# Patient Record
Sex: Female | Born: 1935 | Race: White | Hispanic: No | Smoking: Never smoker
Health system: Southern US, Community
[De-identification: ages and names within clinical notes are randomized; demographics above are authoritative.]

## PROBLEM LIST (undated history)

## (undated) DIAGNOSIS — E119 Type 2 diabetes mellitus without complications: Secondary | ICD-10-CM

## (undated) DIAGNOSIS — K219 Gastro-esophageal reflux disease without esophagitis: Secondary | ICD-10-CM

## (undated) DIAGNOSIS — F329 Major depressive disorder, single episode, unspecified: Secondary | ICD-10-CM

## (undated) DIAGNOSIS — F32A Depression, unspecified: Secondary | ICD-10-CM

## (undated) DIAGNOSIS — D649 Anemia, unspecified: Secondary | ICD-10-CM

## (undated) DIAGNOSIS — Z950 Presence of cardiac pacemaker: Secondary | ICD-10-CM

## (undated) DIAGNOSIS — F419 Anxiety disorder, unspecified: Secondary | ICD-10-CM

## (undated) DIAGNOSIS — I4891 Unspecified atrial fibrillation: Secondary | ICD-10-CM

## (undated) DIAGNOSIS — M1990 Unspecified osteoarthritis, unspecified site: Secondary | ICD-10-CM

## (undated) DIAGNOSIS — R296 Repeated falls: Secondary | ICD-10-CM

## (undated) DIAGNOSIS — R4702 Dysphasia: Secondary | ICD-10-CM

## (undated) DIAGNOSIS — I5030 Unspecified diastolic (congestive) heart failure: Secondary | ICD-10-CM

## (undated) HISTORY — PX: KNEE ARTHROCENTESIS: SUR44

---

## 2018-03-06 ENCOUNTER — Other Ambulatory Visit
Admission: RE | Admit: 2018-03-06 | Discharge: 2018-03-06 | Disposition: A | Discharge status: Home / Self Care | Payer: Medicare HMO | Source: Ambulatory Visit | Attending: Family Medicine | Admitting: Family Medicine

## 2018-03-06 DIAGNOSIS — I10 Essential (primary) hypertension: Secondary | ICD-10-CM | POA: Diagnosis not present

## 2018-03-06 DIAGNOSIS — N390 Urinary tract infection, site not specified: Secondary | ICD-10-CM | POA: Insufficient documentation

## 2018-03-06 LAB — URINALYSIS, COMPLETE (UACMP) WITH MICROSCOPIC
Bilirubin Urine: NEGATIVE
Glucose, UA: NEGATIVE mg/dL
Hgb urine dipstick: NEGATIVE
KETONES UR: NEGATIVE mg/dL
Leukocytes, UA: NEGATIVE
Nitrite: NEGATIVE
Protein, ur: NEGATIVE mg/dL
Specific Gravity, Urine: 1.012 (ref 1.005–1.030)
pH: 5 (ref 5.0–8.0)

## 2018-03-06 LAB — COMPREHENSIVE METABOLIC PANEL
ALT: 13 U/L (ref 0–44)
AST: 23 U/L (ref 15–41)
Albumin: 2.8 g/dL — ABNORMAL LOW (ref 3.5–5.0)
Alkaline Phosphatase: 61 U/L (ref 38–126)
Anion gap: 14 (ref 5–15)
BUN: 45 mg/dL — ABNORMAL HIGH (ref 8–23)
CO2: 27 mmol/L (ref 22–32)
Calcium: 8 mg/dL — ABNORMAL LOW (ref 8.9–10.3)
Chloride: 96 mmol/L — ABNORMAL LOW (ref 98–111)
Creatinine, Ser: 1.38 mg/dL — ABNORMAL HIGH (ref 0.44–1.00)
GFR calc Af Amer: 41 mL/min — ABNORMAL LOW (ref >60)
GFR calc non Af Amer: 36 mL/min — ABNORMAL LOW (ref >60)
GLUCOSE: 141 mg/dL — AB (ref 70–99)
Potassium: 3.6 mmol/L (ref 3.5–5.1)
Sodium: 137 mmol/L (ref 135–145)
Total Bilirubin: 1.3 mg/dL — ABNORMAL HIGH (ref 0.3–1.2)
Total Protein: 5.9 g/dL — ABNORMAL LOW (ref 6.5–8.1)

## 2018-03-06 LAB — CBC WITH DIFFERENTIAL/PLATELET
Abs Immature Granulocytes: 0.03 10*3/uL (ref 0.00–0.07)
Basophils Absolute: 0 10*3/uL (ref 0.0–0.1)
Basophils Relative: 1 %
EOS ABS: 0 10*3/uL (ref 0.0–0.5)
EOS PCT: 2 %
HCT: 34.2 % — ABNORMAL LOW (ref 36.0–46.0)
Hemoglobin: 10.1 g/dL — ABNORMAL LOW (ref 12.0–15.0)
Immature Granulocytes: 2 %
Lymphocytes Relative: 40 %
Lymphs Abs: 0.8 10*3/uL (ref 0.7–4.0)
MCH: 28.3 pg (ref 26.0–34.0)
MCHC: 29.5 g/dL — ABNORMAL LOW (ref 30.0–36.0)
MCV: 95.8 fL (ref 80.0–100.0)
Monocytes Absolute: 0.1 10*3/uL (ref 0.1–1.0)
Monocytes Relative: 6 %
Neutro Abs: 1 10*3/uL — ABNORMAL LOW (ref 1.7–7.7)
Neutrophils Relative %: 49 %
Platelets: 252 10*3/uL (ref 150–400)
RBC: 3.57 MIL/uL — ABNORMAL LOW (ref 3.87–5.11)
RDW: 22.6 % — ABNORMAL HIGH (ref 11.5–15.5)
WBC: 2 10*3/uL — ABNORMAL LOW (ref 4.0–10.5)
nRBC: 4.1 % — ABNORMAL HIGH (ref 0.0–0.2)

## 2018-03-07 LAB — PATHOLOGIST SMEAR REVIEW

## 2018-03-08 LAB — URINE CULTURE: Culture: >=100000 — AB

## 2018-03-13 ENCOUNTER — Other Ambulatory Visit: Payer: Self-pay

## 2018-03-13 ENCOUNTER — Emergency Department: Payer: Medicare HMO

## 2018-03-13 ENCOUNTER — Inpatient Hospital Stay
Admission: EM | Admit: 2018-03-13 | Discharge: 2018-03-16 | DRG: 308 | Disposition: A | Discharge status: Nursing Home (Includes ICF) | Payer: Medicare HMO | Attending: Internal Medicine | Admitting: Internal Medicine

## 2018-03-13 ENCOUNTER — Encounter: Payer: Self-pay | Admitting: Emergency Medicine

## 2018-03-13 DIAGNOSIS — Z7951 Long term (current) use of inhaled steroids: Secondary | ICD-10-CM | POA: Diagnosis not present

## 2018-03-13 DIAGNOSIS — F0390 Unspecified dementia without behavioral disturbance: Secondary | ICD-10-CM | POA: Diagnosis present

## 2018-03-13 DIAGNOSIS — R778 Other specified abnormalities of plasma proteins: Secondary | ICD-10-CM

## 2018-03-13 DIAGNOSIS — I248 Other forms of acute ischemic heart disease: Secondary | ICD-10-CM | POA: Diagnosis present

## 2018-03-13 DIAGNOSIS — I495 Sick sinus syndrome: Secondary | ICD-10-CM | POA: Diagnosis present

## 2018-03-13 DIAGNOSIS — R634 Abnormal weight loss: Secondary | ICD-10-CM | POA: Diagnosis not present

## 2018-03-13 DIAGNOSIS — Z7901 Long term (current) use of anticoagulants: Secondary | ICD-10-CM | POA: Diagnosis not present

## 2018-03-13 DIAGNOSIS — N289 Disorder of kidney and ureter, unspecified: Secondary | ICD-10-CM

## 2018-03-13 DIAGNOSIS — I959 Hypotension, unspecified: Secondary | ICD-10-CM | POA: Diagnosis present

## 2018-03-13 DIAGNOSIS — R7989 Other specified abnormal findings of blood chemistry: Secondary | ICD-10-CM | POA: Diagnosis not present

## 2018-03-13 DIAGNOSIS — I4821 Permanent atrial fibrillation: Secondary | ICD-10-CM | POA: Diagnosis not present

## 2018-03-13 DIAGNOSIS — W19XXXD Unspecified fall, subsequent encounter: Secondary | ICD-10-CM | POA: Diagnosis not present

## 2018-03-13 DIAGNOSIS — F329 Major depressive disorder, single episode, unspecified: Secondary | ICD-10-CM | POA: Diagnosis present

## 2018-03-13 DIAGNOSIS — M069 Rheumatoid arthritis, unspecified: Secondary | ICD-10-CM | POA: Diagnosis present

## 2018-03-13 DIAGNOSIS — K219 Gastro-esophageal reflux disease without esophagitis: Secondary | ICD-10-CM | POA: Diagnosis present

## 2018-03-13 DIAGNOSIS — Z66 Do not resuscitate: Secondary | ICD-10-CM | POA: Diagnosis present

## 2018-03-13 DIAGNOSIS — E875 Hyperkalemia: Secondary | ICD-10-CM | POA: Diagnosis not present

## 2018-03-13 DIAGNOSIS — I878 Other specified disorders of veins: Secondary | ICD-10-CM | POA: Diagnosis present

## 2018-03-13 DIAGNOSIS — Z880 Allergy status to penicillin: Secondary | ICD-10-CM

## 2018-03-13 DIAGNOSIS — R296 Repeated falls: Secondary | ICD-10-CM | POA: Diagnosis present

## 2018-03-13 DIAGNOSIS — G9341 Metabolic encephalopathy: Secondary | ICD-10-CM | POA: Diagnosis present

## 2018-03-13 DIAGNOSIS — I5032 Chronic diastolic (congestive) heart failure: Secondary | ICD-10-CM | POA: Diagnosis present

## 2018-03-13 DIAGNOSIS — Z9981 Dependence on supplemental oxygen: Secondary | ICD-10-CM

## 2018-03-13 DIAGNOSIS — E119 Type 2 diabetes mellitus without complications: Secondary | ICD-10-CM | POA: Diagnosis present

## 2018-03-13 DIAGNOSIS — N179 Acute kidney failure, unspecified: Secondary | ICD-10-CM | POA: Diagnosis present

## 2018-03-13 DIAGNOSIS — N189 Chronic kidney disease, unspecified: Secondary | ICD-10-CM | POA: Diagnosis not present

## 2018-03-13 DIAGNOSIS — D631 Anemia in chronic kidney disease: Secondary | ICD-10-CM | POA: Diagnosis present

## 2018-03-13 DIAGNOSIS — I110 Hypertensive heart disease with heart failure: Secondary | ICD-10-CM | POA: Diagnosis present

## 2018-03-13 DIAGNOSIS — Z515 Encounter for palliative care: Secondary | ICD-10-CM | POA: Diagnosis present

## 2018-03-13 DIAGNOSIS — Z882 Allergy status to sulfonamides status: Secondary | ICD-10-CM | POA: Diagnosis not present

## 2018-03-13 DIAGNOSIS — F419 Anxiety disorder, unspecified: Secondary | ICD-10-CM | POA: Diagnosis present

## 2018-03-13 DIAGNOSIS — I4891 Unspecified atrial fibrillation: Secondary | ICD-10-CM | POA: Diagnosis present

## 2018-03-13 DIAGNOSIS — Z7983 Long term (current) use of bisphosphonates: Secondary | ICD-10-CM | POA: Diagnosis not present

## 2018-03-13 DIAGNOSIS — E860 Dehydration: Secondary | ICD-10-CM | POA: Diagnosis present

## 2018-03-13 DIAGNOSIS — Z950 Presence of cardiac pacemaker: Secondary | ICD-10-CM

## 2018-03-13 DIAGNOSIS — Z888 Allergy status to other drugs, medicaments and biological substances status: Secondary | ICD-10-CM | POA: Diagnosis not present

## 2018-03-13 DIAGNOSIS — Z79899 Other long term (current) drug therapy: Secondary | ICD-10-CM

## 2018-03-13 DIAGNOSIS — L8990 Pressure ulcer of unspecified site, unspecified stage: Secondary | ICD-10-CM

## 2018-03-13 DIAGNOSIS — M160 Bilateral primary osteoarthritis of hip: Secondary | ICD-10-CM | POA: Diagnosis present

## 2018-03-13 HISTORY — DX: Gastro-esophageal reflux disease without esophagitis: K21.9

## 2018-03-13 HISTORY — DX: Anxiety disorder, unspecified: F41.9

## 2018-03-13 HISTORY — DX: Presence of cardiac pacemaker: Z95.0

## 2018-03-13 HISTORY — DX: Dysphasia: R47.02

## 2018-03-13 HISTORY — DX: Depression, unspecified: F32.A

## 2018-03-13 HISTORY — DX: Type 2 diabetes mellitus without complications: E11.9

## 2018-03-13 HISTORY — DX: Unspecified osteoarthritis, unspecified site: M19.90

## 2018-03-13 HISTORY — DX: Repeated falls: R29.6

## 2018-03-13 HISTORY — DX: Anemia, unspecified: D64.9

## 2018-03-13 HISTORY — DX: Unspecified diastolic (congestive) heart failure: I50.30

## 2018-03-13 HISTORY — DX: Unspecified atrial fibrillation: I48.91

## 2018-03-13 HISTORY — DX: Major depressive disorder, single episode, unspecified: F32.9

## 2018-03-13 LAB — CBC
HCT: 34.9 % — ABNORMAL LOW (ref 36.0–46.0)
Hemoglobin: 10.5 g/dL — ABNORMAL LOW (ref 12.0–15.0)
MCH: 28.1 pg (ref 26.0–34.0)
MCHC: 30.1 g/dL (ref 30.0–36.0)
MCV: 93.3 fL (ref 80.0–100.0)
NRBC: 0.6 % — AB (ref 0.0–0.2)
Platelets: 295 10*3/uL (ref 150–400)
RBC: 3.74 MIL/uL — ABNORMAL LOW (ref 3.87–5.11)
RDW: 22.7 % — ABNORMAL HIGH (ref 11.5–15.5)
WBC: 5.4 10*3/uL (ref 4.0–10.5)

## 2018-03-13 LAB — BASIC METABOLIC PANEL
Anion gap: 14 (ref 5–15)
BUN: 36 mg/dL — ABNORMAL HIGH (ref 8–23)
CO2: 24 mmol/L (ref 22–32)
CREATININE: 1.86 mg/dL — AB (ref 0.44–1.00)
Calcium: 8.9 mg/dL (ref 8.9–10.3)
Chloride: 92 mmol/L — ABNORMAL LOW (ref 98–111)
GFR calc Af Amer: 29 mL/min — ABNORMAL LOW (ref >60)
GFR calc non Af Amer: 25 mL/min — ABNORMAL LOW (ref >60)
Glucose, Bld: 144 mg/dL — ABNORMAL HIGH (ref 70–99)
Potassium: 4.3 mmol/L (ref 3.5–5.1)
Sodium: 130 mmol/L — ABNORMAL LOW (ref 135–145)

## 2018-03-13 LAB — URINALYSIS, COMPLETE (UACMP) WITH MICROSCOPIC
Bilirubin Urine: NEGATIVE
Glucose, UA: NEGATIVE mg/dL
Ketones, ur: NEGATIVE mg/dL
Leukocytes, UA: NEGATIVE
Nitrite: NEGATIVE
Protein, ur: NEGATIVE mg/dL
Specific Gravity, Urine: 1.012 (ref 1.005–1.030)
pH: 5 (ref 5.0–8.0)

## 2018-03-13 LAB — TROPONIN I: Troponin I: 0.06 ng/mL (ref <0.03)

## 2018-03-13 LAB — TSH: TSH: 4.366 u[IU]/mL (ref 0.350–4.500)

## 2018-03-13 MED ORDER — NYSTATIN 100000 UNIT/ML MT SUSP
5.0000 mL | Freq: Four times a day (QID) | OROMUCOSAL | Status: DC
  Administered 2018-03-13 – 2018-03-15 (×6): 500000 [IU] via ORAL
  Filled 2018-03-13 (×8): qty 5

## 2018-03-13 MED ORDER — POTASSIUM CHLORIDE CRYS ER 10 MEQ PO TBCR
10.0000 meq | EXTENDED_RELEASE_TABLET | Freq: Every day | ORAL | Status: DC
  Administered 2018-03-13 – 2018-03-14 (×2): 10 meq via ORAL
  Filled 2018-03-13 (×2): qty 1

## 2018-03-13 MED ORDER — DILTIAZEM HCL ER COATED BEADS 120 MG PO CP24
120.0000 mg | ORAL_CAPSULE | Freq: Every day | ORAL | Status: DC
  Administered 2018-03-13: 120 mg via ORAL
  Filled 2018-03-13 (×2): qty 1

## 2018-03-13 MED ORDER — DOCUSATE SODIUM 100 MG PO CAPS
100.0000 mg | ORAL_CAPSULE | Freq: Two times a day (BID) | ORAL | Status: DC
  Administered 2018-03-13 – 2018-03-15 (×4): 100 mg via ORAL
  Filled 2018-03-13 (×5): qty 1

## 2018-03-13 MED ORDER — SODIUM CHLORIDE 0.9 % IV SOLN
INTRAVENOUS | Status: AC
  Administered 2018-03-13 (×2): via INTRAVENOUS

## 2018-03-13 MED ORDER — METHOTREXATE 2.5 MG PO TABS
12.5000 mg | ORAL_TABLET | ORAL | Status: DC
  Filled 2018-03-13: qty 5

## 2018-03-13 MED ORDER — FUROSEMIDE 40 MG PO TABS: 40.0000 mg | ORAL_TABLET | Freq: Every day | ORAL | Status: DC

## 2018-03-13 MED ORDER — PANTOPRAZOLE SODIUM 40 MG PO TBEC
40.0000 mg | DELAYED_RELEASE_TABLET | Freq: Every day | ORAL | Status: DC
  Administered 2018-03-13 – 2018-03-15 (×3): 40 mg via ORAL
  Filled 2018-03-13 (×3): qty 1

## 2018-03-13 MED ORDER — DULOXETINE HCL 30 MG PO CPEP
30.0000 mg | ORAL_CAPSULE | Freq: Every day | ORAL | Status: DC
  Administered 2018-03-13 – 2018-03-15 (×3): 30 mg via ORAL
  Filled 2018-03-13 (×4): qty 1

## 2018-03-13 MED ORDER — BISACODYL 5 MG PO TBEC: 5.0000 mg | DELAYED_RELEASE_TABLET | Freq: Every day | ORAL | Status: DC | PRN

## 2018-03-13 MED ORDER — DILTIAZEM HCL 25 MG/5ML IV SOLN
10.0000 mg | Freq: Once | INTRAVENOUS | Status: AC
  Administered 2018-03-13: 10 mg via INTRAVENOUS
  Filled 2018-03-13: qty 5

## 2018-03-13 MED ORDER — FUROSEMIDE 40 MG PO TABS: 80.0000 mg | ORAL_TABLET | Freq: Two times a day (BID) | ORAL | Status: DC | PRN

## 2018-03-13 MED ORDER — ALENDRONATE SODIUM 70 MG PO TABS: 70.0000 mg | ORAL_TABLET | ORAL | Status: DC

## 2018-03-13 MED ORDER — FOLIC ACID 1 MG PO TABS
1.0000 mg | ORAL_TABLET | Freq: Every day | ORAL | Status: DC
  Administered 2018-03-13 – 2018-03-15 (×3): 1 mg via ORAL
  Filled 2018-03-13 (×3): qty 1

## 2018-03-13 MED ORDER — DILTIAZEM HCL-DEXTROSE 100-5 MG/100ML-% IV SOLN (PREMIX)
5.0000 mg/h | INTRAVENOUS | Status: DC
  Administered 2018-03-13: 5 mg/h via INTRAVENOUS
  Filled 2018-03-13: qty 100

## 2018-03-13 MED ORDER — METOPROLOL TARTRATE 50 MG PO TABS
100.0000 mg | ORAL_TABLET | Freq: Two times a day (BID) | ORAL | Status: DC
  Administered 2018-03-13: 100 mg via ORAL
  Filled 2018-03-13 (×2): qty 2

## 2018-03-13 MED ORDER — ORAL CARE MOUTH RINSE
15.0000 mL | Freq: Two times a day (BID) | OROMUCOSAL | Status: DC
  Administered 2018-03-13 – 2018-03-16 (×4): 15 mL via OROMUCOSAL

## 2018-03-13 MED ORDER — ESCITALOPRAM OXALATE 10 MG PO TABS
5.0000 mg | ORAL_TABLET | Freq: Every day | ORAL | Status: DC
  Administered 2018-03-13 – 2018-03-15 (×3): 5 mg via ORAL
  Filled 2018-03-13 (×4): qty 0.5

## 2018-03-13 MED ORDER — RIVAROXABAN 15 MG PO TABS
15.0000 mg | ORAL_TABLET | Freq: Every day | ORAL | Status: DC
  Administered 2018-03-13 – 2018-03-14 (×2): 15 mg via ORAL
  Filled 2018-03-13 (×2): qty 1

## 2018-03-13 MED ORDER — FLUCONAZOLE 100 MG PO TABS
100.0000 mg | ORAL_TABLET | Freq: Every day | ORAL | Status: DC
  Administered 2018-03-13 – 2018-03-14 (×2): 100 mg via ORAL
  Filled 2018-03-13 (×2): qty 1

## 2018-03-13 MED ORDER — SENNOSIDES-DOCUSATE SODIUM 8.6-50 MG PO TABS
2.0000 | ORAL_TABLET | Freq: Every day | ORAL | Status: DC
  Administered 2018-03-13 – 2018-03-15 (×2): 2 via ORAL
  Filled 2018-03-13 (×3): qty 2

## 2018-03-13 MED ORDER — ONDANSETRON HCL 4 MG PO TABS: 4.0000 mg | ORAL_TABLET | Freq: Four times a day (QID) | ORAL | Status: DC | PRN

## 2018-03-13 MED ORDER — SODIUM CHLORIDE 0.9 % IV BOLUS
500.0000 mL | Freq: Once | INTRAVENOUS | Status: AC
  Administered 2018-03-13: 500 mL via INTRAVENOUS

## 2018-03-13 MED ORDER — ACETAMINOPHEN 325 MG PO TABS
650.0000 mg | ORAL_TABLET | Freq: Four times a day (QID) | ORAL | Status: DC | PRN
  Administered 2018-03-15 – 2018-03-16 (×2): 650 mg via ORAL
  Filled 2018-03-13 (×2): qty 2

## 2018-03-13 MED ORDER — FERROUS SULFATE 325 (65 FE) MG PO TABS
325.0000 mg | ORAL_TABLET | Freq: Every day | ORAL | Status: DC
  Administered 2018-03-13 – 2018-03-15 (×3): 325 mg via ORAL
  Filled 2018-03-13 (×3): qty 1

## 2018-03-13 MED ORDER — PREDNISONE 5 MG PO TABS
7.5000 mg | ORAL_TABLET | Freq: Every day | ORAL | Status: DC
  Administered 2018-03-13 – 2018-03-15 (×3): 7.5 mg via ORAL
  Filled 2018-03-13 (×4): qty 1.5

## 2018-03-13 MED ORDER — POLYETHYLENE GLYCOL 3350 17 G PO PACK
17.0000 g | PACK | Freq: Every day | ORAL | Status: DC
  Administered 2018-03-14: 17 g via ORAL
  Filled 2018-03-13: qty 1

## 2018-03-13 MED ORDER — DILTIAZEM HCL 25 MG/5ML IV SOLN
10.0000 mg | Freq: Once | INTRAVENOUS | Status: AC
  Administered 2018-03-13: 10 mg via INTRAVENOUS

## 2018-03-13 MED ORDER — VITAMIN B-12 1000 MCG PO TABS
1000.0000 ug | ORAL_TABLET | Freq: Every day | ORAL | Status: DC
  Administered 2018-03-13 – 2018-03-15 (×3): 1000 ug via ORAL
  Filled 2018-03-13 (×3): qty 1

## 2018-03-13 MED ORDER — ADULT MULTIVITAMIN W/MINERALS CH
1.0000 | ORAL_TABLET | Freq: Every day | ORAL | Status: DC
  Administered 2018-03-14 – 2018-03-15 (×2): 1 via ORAL
  Filled 2018-03-13 (×2): qty 1

## 2018-03-13 MED ORDER — ACETAMINOPHEN 650 MG RE SUPP: 650.0000 mg | Freq: Four times a day (QID) | RECTAL | Status: DC | PRN

## 2018-03-13 MED ORDER — ONDANSETRON HCL 4 MG/2ML IJ SOLN: 4.0000 mg | Freq: Four times a day (QID) | INTRAMUSCULAR | Status: DC | PRN

## 2018-03-13 MED ORDER — MORPHINE SULFATE (PF) 2 MG/ML IV SOLN
2.0000 mg | Freq: Once | INTRAVENOUS | Status: AC
  Administered 2018-03-13: 2 mg via INTRAVENOUS
  Filled 2018-03-13: qty 1

## 2018-03-13 NOTE — Progress Notes (Signed)

## 2018-03-13 NOTE — ED Notes (Addendum)
Daughter Cindy Hogan So called  [phone] and given update on pt. Pt's daughter verified pt's hx with this RN.

## 2018-03-13 NOTE — Clinical Social Work Note (Addendum)
CSW spoke with patient's daughter Cindy Hogan [phone], she stated that [facility] was [facility] called [facility] in [place] on Tuesday.  Patient's daughter would like patient to discharge back to ALF from hospital instead of going back to [facility].  CSW attempted to contact Misty Stanley the administrator for ALF [phone], and she is out of the office today.  CSW left a message awaiting for call back.  CSW completed assessment with patient's daughter due to patient having some confusion today.  CSW to continue to follow patient's progress throughout discharge planning.  Ervin Knack. Clare Casto, MSW, Theresia Majors [phone]  03/13/2018 5:22 PM

## 2018-03-13 NOTE — ED Notes (Signed)
Ernestine at [facility] contacted and informed about pt's admission status.

## 2018-03-13 NOTE — H&P (Signed)
Cindy Hogan is an 84 y.o. female.   Chief Complaint: Fall HPI: The patient with past medical history of CHF, atrial fibrillation and rheumatoid arthritis presents to the emergency department after a fall.  She was at [facility] when she was found on the floor.  The patient complained of pain in her hip, particularly on the left.  X-ray demonstrated no fractures.  However the patient was found to be in atrial fibrillation with rapid ventricular rate.  She denies chest pain or shortness of breath.  2 IV boluses of diltiazem were administered but did not significantly improve heart rate which prompted the emergency department staff to start a diltiazem drip prior to calling the hospitalist service for admission.  Past Medical History:  Diagnosis Date  . Anemia   . Arthritis   . Atrial fibrillation (HCC)   . CHF (congestive heart failure) (HCC)   . Depression   . Dysphasia   . GERD (gastroesophageal reflux disease)     Past Surgical History:  Procedure Laterality Date  . KNEE ARTHROCENTESIS      No family history on file. Heart disease in older generations  Social History:  reports previous alcohol use. She reports previous drug use. No history on file for tobacco.  Allergies:  Allergies  Allergen Reactions  . Eliquis [Apixaban]   . Lisinopril   . Neosporin [Neomycin-Bacitracin Zn-Polymyx]   . Penicillins   . Sulfa Antibiotics     Medications Prior to Admission  Medication Sig Dispense Refill  . albuterol (PROVENTIL HFA;VENTOLIN HFA) 108 (90 Base) MCG/ACT inhaler Inhale 2 puffs into the lungs every 6 (six) hours as needed for wheezing or shortness of breath.    Marland Kitchen alendronate (FOSAMAX) 70 MG tablet Take 70 mg by mouth once a week. Take with a full glass of water on an empty stomach.    . diltiazem (CARDIZEM CD) 120 MG 24 hr capsule Take 120 mg by mouth daily.    . DULoxetine (CYMBALTA) 30 MG capsule Take 30 mg by mouth daily.    Marland Kitchen escitalopram (LEXAPRO) 5 MG tablet Take 5 mg  by mouth daily.    . ferrous sulfate 325 (65 FE) MG tablet Take 325 mg by mouth daily.    . fluconazole (DIFLUCAN) 100 MG tablet Take 100 mg by mouth daily.    . folic acid (FOLVITE) 1 MG tablet Take 1 mg by mouth daily.    . furosemide (LASIX) 40 MG tablet Take 80 mg by mouth 2 (two) times daily as needed (weight gain of 2lbs in 1 day or 5lbs in 1 week).    . furosemide (LASIX) 40 MG tablet Take 40 mg by mouth daily.    . methotrexate (RHEUMATREX) 2.5 MG tablet Take 12.5 mg by mouth every Thursday. Caution:Chemotherapy. Protect from light.    . metoprolol tartrate (LOPRESSOR) 100 MG tablet Take 100 mg by mouth 2 (two) times daily.    . Multiple Vitamin (MULTIVITAMIN WITH MINERALS) TABS tablet Take 1 tablet by mouth daily.    Marland Kitchen nystatin (MYCOSTATIN) 100000 UNIT/ML suspension Take 5 mLs by mouth 4 (four) times daily.    . pantoprazole (PROTONIX) 40 MG tablet Take 40 mg by mouth daily.    . polyethylene glycol (MIRALAX / GLYCOLAX) packet Take 17 g by mouth daily.    . potassium chloride (K-DUR,KLOR-CON) 10 MEQ tablet Take 10 mEq by mouth daily.    . predniSONE (DELTASONE) 2.5 MG tablet Take 7.5 mg by mouth daily.    . Rivaroxaban (  XARELTO) 15 MG TABS tablet Take 15 mg by mouth daily at 6 PM.    . senna-docusate (SENOKOT-S) 8.6-50 MG tablet Take 2 tablets by mouth at bedtime.    . vitamin B-12 (CYANOCOBALAMIN) 1000 MCG tablet Take 1,000 mcg by mouth daily.      Results for orders placed or performed during the hospital encounter of 03/13/18 (from the past 48 hour(s))  Basic metabolic panel     Status: Abnormal   Collection Time: 03/13/18  2:31 AM  Result Value Ref Range   Sodium 130 (L) 135 - 145 mmol/L   Potassium 4.3 3.5 - 5.1 mmol/L   Chloride 92 (L) 98 - 111 mmol/L   CO2 24 22 - 32 mmol/L   Glucose, Bld 144 (H) 70 - 99 mg/dL   BUN 36 (H) 8 - 23 mg/dL   Creatinine, Ser 1.01 (H) 0.44 - 1.00 mg/dL   Calcium 8.9 8.9 - 75.1 mg/dL   GFR calc non Af Amer 25 (L) >60 mL/min   GFR calc Af Amer  29 (L) >60 mL/min   Anion gap 14 5 - 15    Comment: Performed at [facility], [address] [place], [place] [address]  CBC     Status: Abnormal   Collection Time: 03/13/18  2:31 AM  Result Value Ref Range   WBC 5.4 4.0 - 10.5 K/uL   RBC 3.74 (L) 3.87 - 5.11 MIL/uL   Hemoglobin 10.5 (L) 12.0 - 15.0 g/dL   HCT 27.7 (L) 82.4 - 23.5 %   MCV 93.3 80.0 - 100.0 fL   MCH 28.1 26.0 - 34.0 pg   MCHC 30.1 30.0 - 36.0 g/dL   RDW 36.1 (H) 44.3 - 15.4 %   Platelets 295 150 - 400 K/uL   nRBC 0.6 (H) 0.0 - 0.2 %    Comment: Performed at [facility], [address] [place], [place] [address]  Troponin I - Once     Status: Abnormal   Collection Time: 03/13/18  2:31 AM  Result Value Ref Range   Troponin I 0.06 (HH) <0.03 ng/mL    Comment: CRITICAL RESULT CALLED TO, READ BACK BY AND VERIFIED WITH BECCA LYNN ON 03/13/18 AT 0309 [facility] Performed at [facility], [address] [place], [place] [address]   Urinalysis, Complete w Microscopic     Status: Abnormal   Collection Time: 03/13/18  2:31 AM  Result Value Ref Range   Color, Urine AMBER (A) YELLOW    Comment: BIOCHEMICALS MAY BE AFFECTED BY COLOR   APPearance HAZY (A) CLEAR   Specific Gravity, Urine 1.012 1.005 - 1.030   pH 5.0 5.0 - 8.0   Glucose, UA NEGATIVE NEGATIVE mg/dL   Hgb urine dipstick MODERATE (A) NEGATIVE   Bilirubin Urine NEGATIVE NEGATIVE   Ketones, ur NEGATIVE NEGATIVE mg/dL   Protein, ur NEGATIVE NEGATIVE mg/dL   Nitrite NEGATIVE NEGATIVE   Leukocytes, UA NEGATIVE NEGATIVE   RBC / HPF 6-10 0 - 5 RBC/hpf   WBC, UA 0-5 0 - 5 WBC/hpf   Bacteria, UA RARE (A) NONE SEEN   Squamous Epithelial / LPF 0-5 0 - 5   Budding Yeast PRESENT     Comment: Performed at [facility], [address] [place], [place] [address]   Ct Head Wo Contrast  Result Date: 03/13/2018 CLINICAL DATA:  Found down. RIGHT leg bruising. History of atrial fibrillation. EXAM: CT HEAD WITHOUT CONTRAST CT CERVICAL  SPINE WITHOUT CONTRAST TECHNIQUE: Multidetector CT  imaging of the head and cervical spine was performed following the standard protocol without intravenous contrast. Multiplanar CT image reconstructions of the cervical spine were also generated. COMPARISON:  None. FINDINGS: CT HEAD FINDINGS-mild motion degraded examination. BRAIN: No intraparenchymal hemorrhage, mass effect nor midline shift. The ventricles and sulci are normal for age. Patchy supratentorial white matter hypodensities. No acute large vascular territory infarcts. No abnormal extra-axial fluid collections. Basal cisterns are patent. VASCULAR: Mild-to-moderate calcific atherosclerosis of the carotid siphons. SKULL: No skull fracture. No significant scalp soft tissue swelling. SINUSES/ORBITS: Trace paranasal sinus mucosal thickening. Minimal LEFT mastoid effusion. Included ocular globes and orbital contents are non-suspicious. Status post bilateral ocular lens implants. Punctate LEFT ocular globe anterior chain burr foreign body most compatible with glaucoma drainage device. OTHER: None. CT CERVICAL SPINE FINDINGS ALIGNMENT: Straightened cervical lordosis. Grade 1 C3-4 through C5-6 and C7-T1 anterolisthesis. SKULL BASE AND VERTEBRAE: Vertebral bodies intact. Multilevel severe degenerative discs and severe facet arthropathy. C1-2 articulation maintained with severe arthropathy. RIGHT atlantooccipital arthrodesis. Old LEFT second rib fracture with callus. No destructive bony lesions. SOFT TISSUES AND SPINAL CANAL: Nonacute. Heterogeneous thyroid. Moderate calcific atherosclerosis carotid bifurcations. DISC LEVELS: Severe canal stenosis C3-4 and C5-6. Severe neural foraminal narrowing all cervical levels. UPPER CHEST: Lung apices are clear. Pacemaker leads via LEFT subclavian venous approach. OTHER: None. IMPRESSION: CT HEAD: 1. No acute intracranial process. 2. Moderate parenchymal brain volume loss and moderate chronic small vessel ischemic changes. CT  cervical spine: 1. No fracture.  Multilevel spondylolisthesis on degenerative basis. 2. Severe canal stenosis C3-4 and C5-6. Severe neural foraminal narrowing all levels. Electronically Signed   By: Awilda Metro M.D.   On: 03/13/2018 03:37   Ct Cervical Spine Wo Contrast  Result Date: 03/13/2018 CLINICAL DATA:  Found down. RIGHT leg bruising. History of atrial fibrillation. EXAM: CT HEAD WITHOUT CONTRAST CT CERVICAL SPINE WITHOUT CONTRAST TECHNIQUE: Multidetector CT imaging of the head and cervical spine was performed following the standard protocol without intravenous contrast. Multiplanar CT image reconstructions of the cervical spine were also generated. COMPARISON:  None. FINDINGS: CT HEAD FINDINGS-mild motion degraded examination. BRAIN: No intraparenchymal hemorrhage, mass effect nor midline shift. The ventricles and sulci are normal for age. Patchy supratentorial white matter hypodensities. No acute large vascular territory infarcts. No abnormal extra-axial fluid collections. Basal cisterns are patent. VASCULAR: Mild-to-moderate calcific atherosclerosis of the carotid siphons. SKULL: No skull fracture. No significant scalp soft tissue swelling. SINUSES/ORBITS: Trace paranasal sinus mucosal thickening. Minimal LEFT mastoid effusion. Included ocular globes and orbital contents are non-suspicious. Status post bilateral ocular lens implants. Punctate LEFT ocular globe anterior chain burr foreign body most compatible with glaucoma drainage device. OTHER: None. CT CERVICAL SPINE FINDINGS ALIGNMENT: Straightened cervical lordosis. Grade 1 C3-4 through C5-6 and C7-T1 anterolisthesis. SKULL BASE AND VERTEBRAE: Vertebral bodies intact. Multilevel severe degenerative discs and severe facet arthropathy. C1-2 articulation maintained with severe arthropathy. RIGHT atlantooccipital arthrodesis. Old LEFT second rib fracture with callus. No destructive bony lesions. SOFT TISSUES AND SPINAL CANAL: Nonacute.  Heterogeneous thyroid. Moderate calcific atherosclerosis carotid bifurcations. DISC LEVELS: Severe canal stenosis C3-4 and C5-6. Severe neural foraminal narrowing all cervical levels. UPPER CHEST: Lung apices are clear. Pacemaker leads via LEFT subclavian venous approach. OTHER: None. IMPRESSION: CT HEAD: 1. No acute intracranial process. 2. Moderate parenchymal brain volume loss and moderate chronic small vessel ischemic changes. CT cervical spine: 1. No fracture.  Multilevel spondylolisthesis on degenerative basis. 2. Severe canal stenosis C3-4 and C5-6. Severe neural foraminal narrowing all levels. Electronically Signed  By: Awilda Metro M.D.   On: 03/13/2018 03:37   Ct Pelvis Wo Contrast  Result Date: 03/13/2018 CLINICAL DATA:  Post fall with left hip pain. EXAM: CT PELVIS WITHOUT CONTRAST TECHNIQUE: Multidetector CT imaging of the pelvis was performed following the standard protocol without intravenous contrast. COMPARISON:  Radiographs earlier this day. FINDINGS: Urinary Tract: Distal ureters are decompressed. Urinary bladder is distended without wall thickening. Bowel: Minimal colonic diverticulosis. Pelvic bowel loops are unremarkable. Normal appendix. Vascular/Lymphatic: Mild aorta bi-iliac atherosclerosis. No adenopathy. Reproductive: Uterine calcifications likely fibroids and vascular. 2.5 cm cystic structure in the left adnexa. Other:  No pelvic free fluid. Musculoskeletal: No pelvic fracture. Particularly, no fracture of the left inferior pubic ramus as questioned on radiograph. Moderate to advanced bilateral hip osteoarthritis. Pubic symphysis is congruent. Mild degenerative change of the sacroiliac joints. Prominent degenerative change in the lower lumbar spine. IMPRESSION: 1. No pelvic fracture. Particularly, no left inferior pubic ramus fracture is questioned on radiograph. 2. Moderate to advanced bilateral hip osteoarthritis. 3. A 2.5 cm cystic structure in the left adnexa is most likely  benign, given size less than 3 cm and late postmenopausal status, no dedicated imaging follow-up is needed. Electronically Signed   By: Narda Rutherford M.D.   On: 03/13/2018 03:53   Dg Chest Port 1 View  Result Date: 03/13/2018 CLINICAL DATA:  Fall. EXAM: PORTABLE CHEST 1 VIEW COMPARISON:  None. FINDINGS: Left-sided pacemaker in place. The heart is enlarged. There is atherosclerosis of the thoracic aorta. No pneumothorax, focal airspace disease, pulmonary edema or pleural effusion. No acute osseous abnormalities are seen. IMPRESSION: Cardiomegaly.  No evidence of acute traumatic injury. Electronically Signed   By: Narda Rutherford M.D.   On: 03/13/2018 03:22   Dg Hip Unilat W Or Wo Pelvis 2-3 Views Left  Result Date: 03/13/2018 CLINICAL DATA:  Post fall with left hip pain. EXAM: DG HIP (WITH OR WITHOUT PELVIS) 2-3V LEFT COMPARISON:  None. FINDINGS: Possible but not definite nondisplaced left inferior ramus fracture. No other fracture of the pelvis. Pubic symphysis and sacroiliac joints are congruent. Moderate to advanced bilateral hip osteoarthritis. Both femoral heads are well-seated in the respective acetabula. IMPRESSION: 1. Possible but not definite nondisplaced left inferior ramus fracture. 2. Moderate to advanced bilateral hip osteoarthritis. Electronically Signed   By: Narda Rutherford M.D.   On: 03/13/2018 03:20    Review of Systems  Constitutional: Negative for chills and fever.  HENT: Negative for sore throat and tinnitus.   Eyes: Negative for blurred vision and redness.  Respiratory: Negative for cough and shortness of breath.   Cardiovascular: Negative for chest pain, palpitations, orthopnea and PND.  Gastrointestinal: Negative for abdominal pain, diarrhea, nausea and vomiting.  Genitourinary: Negative for dysuria, frequency and urgency.  Musculoskeletal: Positive for falls and joint pain. Negative for myalgias.  Skin: Negative for rash.       No lesions  Neurological: Negative  for speech change, focal weakness and weakness.  Endo/Heme/Allergies: Does not bruise/bleed easily.       No temperature intolerance  Psychiatric/Behavioral: Negative for depression and suicidal ideas.    Blood pressure 138/62, pulse 80, temperature (!) 97.5 F (36.4 C), temperature source Oral, resp. rate 18, height 5\' 5"  (1.651 m), weight 60.2 kg, SpO2 95 %. Physical Exam  Vitals reviewed. Constitutional: She is oriented to person, place, and time. She appears well-developed and well-nourished. No distress.  HENT:  Head: Normocephalic and atraumatic.  Mouth/Throat: Oropharynx is clear and moist.  Eyes: Pupils are  equal, round, and reactive to light. Conjunctivae and EOM are normal. No scleral icterus.  Neck: Normal range of motion. Neck supple. No JVD present. No tracheal deviation present. No thyromegaly present.  Cardiovascular: Normal rate, regular rhythm and normal heart sounds. Exam reveals no gallop and no friction rub.  No murmur heard. Respiratory: Effort normal and breath sounds normal.  GI: Soft. Bowel sounds are normal. She exhibits no distension. There is no abdominal tenderness.  Genitourinary:    Genitourinary Comments: Deferred   Musculoskeletal: Normal range of motion.        General: No edema.  Lymphadenopathy:    She has no cervical adenopathy.  Neurological: She is alert and oriented to person, place, and time. No cranial nerve deficit. She exhibits normal muscle tone.  Skin: Skin is warm and dry. No rash noted. No erythema.  Psychiatric: She has a normal mood and affect. Her behavior is normal. Judgment and thought content normal.     Assessment/Plan This is an 84 year old female admitted for atrial fibrillation with rapid ventricular rate. 1.  A. fib: With RVR; continue with diltiazem drip and Xarelto. 2.  Elevated troponin: Secondary to demand ischemia; continue to follow cardiac biomarkers.  Monitor telemetry.  Consultation discretion of primary team 3.   AKI: The patient is clinically very dehydrated.  Skin tense and lips and mucous membranes are dry.  Continue to hydrate with intravenous fluid.  Encourage oral intake. 4.  Falls: PT/OT for safety evaluation prior to discharge. 5.  CHF: The patient requires gentle rehydration.  Do not have an echocardiogram on file.  Obtain imaging for EF and wall motion and to rule out thrombus.  Hold his dose of Lasix today 6.  DVT prophylaxis: Above 7.  GI prophylaxis: Pantoprazole per home regimen The patient is a full code.  I have simply spent 45 minutes in critical care time with this patient  Arnaldo Natal, MD 03/13/2018, 7:22 AM

## 2018-03-13 NOTE — Progress Notes (Signed)
Patient's BP is soft, and HR has been staying 70-80's. OK to discontinue cardizem drip per Dr. Imogene Burn. PAtient already received her PO medications.

## 2018-03-13 NOTE — Clinical Social Work Note (Signed)
Clinical Social Work Assessment  Patient Details  Name: Cindy Hogan MRN: [id number] Date of Birth: [birth year]  Date of referral:  03/13/18               Reason for consult:  Facility Placement                Permission sought to share information with:  Facility Medical sales representative, Family Supports Permission granted to share information::  Yes, Verbal Permission Granted  Name::     Bobette Mo Other   [phone]   Agency::  ALF and SNF admissions  Relationship::     Contact Information:     Housing/Transportation Living arrangements for the past 2 months:  Skilled Holiday representative, Assisted Living Facility Source of Information:  Adult Children, Facility Patient Interpreter Needed:  None Criminal Activity/Legal Involvement Pertinent to Current Situation/Hospitalization:  No - Comment as needed Significant Relationships:  Adult Children Lives with:  Facility Resident Do you feel safe going back to the place where you live?  Yes Need for family participation in patient care:  Yes (Comment)  Care giving concerns:  Patient's daughter would like patient to return back to [facility] ALF where she lives instead of going back to [facility].   Social Worker assessment / plan:  Patient is an 84 year old female who is alert and oriented x2.  Patient has some confusion assessment completed by speaking with patient's daughter on the phone.  Patient's daughter states that patient was at [facility] receiving some short term rehab, however patient's daughter feels that patient would be better off returning back to ALF with home health instead.  Patient's daughter stated that patient was supposed to be returning back to ALF tomorrow, but unfortunately she was admitted into the hospital.  Patient's daughter was explained role of CSW and process for coordinating with ALF to help patient return back to her ALF.  Patient's daughter expressed that ALF, felt comfortable to have  patient return to ALF with home health.  CSW explained the ALF will have to decide if they are able to accept patient back or if she will need to continue with therapy.  Patient's daughter stated she would prefer patient to return to ALF.  Patient's daughter did not have any other questions or concerns, and gave CSW permission to contact ALF to discuss discharge planning.  Employment status:  Retired Database administrator PT Recommendations:  Skilled Nursing Facility Information / Referral to community resources:  Skilled Nursing Facility  Patient/Family's Response to care:  Patient and family are agreeable to returning back to ALF instead of SNF.  Patient/Family's Understanding of and Emotional Response to Diagnosis, Current Treatment, and Prognosis:  Patient and family are aware of current prognosis and treatment plan.  Patient's family are hopeful that she can discharge from the hospital to ALF.  Emotional Assessment Appearance:  Appears stated age Attitude/Demeanor/Rapport:    Affect (typically observed):  Stable, Calm Orientation:  Oriented to Self, Oriented to Place Alcohol / Substance use:  Not Applicable Psych involvement (Current and /or in the community):  No (Comment)  Discharge Needs  Concerns to be addressed:  Cognitive Concerns, Care Coordination Readmission within the last 30 days:  No Current discharge risk:  Cognitively Impaired Barriers to Discharge:  Continued Medical Work up   [place] Constable 03/13/2018, 5:38 PM

## 2018-03-13 NOTE — Progress Notes (Signed)
Metoprolol held due to BP of 99/60 and HR 72. MD made aware. No new orders

## 2018-03-13 NOTE — Progress Notes (Signed)
The patient is confused and looks demented. Vital sign is stable but blood pressure is low side.  Heart rate is better controlled with Cardizem drip.  Physical examinations is done.  Lab reviewed.  This is an 84 year old female admitted for atrial fibrillation with rapid ventricular rate. 1.  A. fib: With RVR;  Continue Xarelto, Cardizem p.o. and Lopressor p.o., discontinued diltiazem drip. 2.  Elevated troponin: Secondary to demand ischemia. 3.  AKI due to dehydration, IV fluid support and follow-up BMP.   4.  Falls: PT/OT for safety evaluation prior to discharge. 5.  CHF: Stable.  Hold Lasix, follow-up echo.  Discussed with RN, time spent about 20 minutes.

## 2018-03-13 NOTE — ED Notes (Signed)
ED TO INPATIENT HANDOFF REPORT  Name/Age/Gender Cindy Hogan 84 y.o. female  Code Status Advance Directive Documentation     Most Recent Value  Type of Advance Directive  Out of facility DNR (pink MOST or yellow form)  Pre-existing out of facility DNR order (yellow form or pink MOST form)  Pink MOST form placed in chart (order not valid for inpatient use)  "MOST" Form in Place?  -      Home/SNF/Other Nursing Home  Chief Complaint fall  Level of Care/Admitting Diagnosis ED Disposition    ED Disposition Condition Comment   Admit  Hospital Area: [facility] [[id number]]  Level of Care: Telemetry [5]  Diagnosis: Atrial fibrillation with RVR [facility]) [[id number]]  Admitting Physician: Arnaldo Natal [[id number]]  Attending Physician: Arnaldo Natal [[id number]]  Estimated length of stay: past midnight tomorrow  Certification:: I certify this patient will need inpatient services for at least 2 midnights  PT Class (Do Not Modify): Inpatient [101]  PT Acc Code (Do Not Modify): Private [1]       Medical History Past Medical History:  Diagnosis Date  . Anemia   . Arthritis   . Atrial fibrillation (HCC)   . CHF (congestive heart failure) (HCC)   . Depression   . Dysphasia   . GERD (gastroesophageal reflux disease)     Allergies Allergies  Allergen Reactions  . Eliquis [Apixaban]   . Lisinopril   . Neosporin [Neomycin-Bacitracin Zn-Polymyx]   . Penicillins   . Sulfa Antibiotics     IV Location/Drains/Wounds Patient Lines/Drains/Airways Status   Active Line/Drains/Airways    Name:   Placement date:   Placement time:   Site:   Days:   Peripheral IV 03/13/18 Left Hand   03/13/18    0237    Hand   less than 1   Peripheral IV 03/13/18 Right Forearm   03/13/18    0243    Forearm   less than 1   Pressure Injury 03/13/18 Stage II -  Partial thickness loss of dermis presenting as a shallow open ulcer with a red, pink wound bed without slough. Sacral dressing  applied.    03/13/18    0307     less than 1          Labs/Imaging Results for orders placed or performed during the hospital encounter of 03/13/18 (from the past 48 hour(s))  Basic metabolic panel     Status: Abnormal   Collection Time: 03/13/18  2:31 AM  Result Value Ref Range   Sodium 130 (L) 135 - 145 mmol/L   Potassium 4.3 3.5 - 5.1 mmol/L   Chloride 92 (L) 98 - 111 mmol/L   CO2 24 22 - 32 mmol/L   Glucose, Bld 144 (H) 70 - 99 mg/dL   BUN 36 (H) 8 - 23 mg/dL   Creatinine, Ser 8.30 (H) 0.44 - 1.00 mg/dL   Calcium 8.9 8.9 - 94.0 mg/dL   GFR calc non Af Amer 25 (L) >60 mL/min   GFR calc Af Amer 29 (L) >60 mL/min   Anion gap 14 5 - 15    Comment: Performed at [facility], [address] [place], [place] [address]  CBC     Status: Abnormal   Collection Time: 03/13/18  2:31 AM  Result Value Ref Range   WBC 5.4 4.0 - 10.5 K/uL   RBC 3.74 (L) 3.87 - 5.11 MIL/uL   Hemoglobin 10.5 (L) 12.0 - 15.0 g/dL   HCT  34.9 (L) 36.0 - 46.0 %   MCV 93.3 80.0 - 100.0 fL   MCH 28.1 26.0 - 34.0 pg   MCHC 30.1 30.0 - 36.0 g/dL   RDW 02.5 (H) 85.2 - 77.8 %   Platelets 295 150 - 400 K/uL   nRBC 0.6 (H) 0.0 - 0.2 %    Comment: Performed at [facility], [address] [place], [place] [address]  Troponin I - Once     Status: Abnormal   Collection Time: 03/13/18  2:31 AM  Result Value Ref Range   Troponin I 0.06 (HH) <0.03 ng/mL    Comment: CRITICAL RESULT CALLED TO, READ BACK BY AND VERIFIED WITH BECCA Shauntell Iglesia ON 03/13/18 AT 0309 [facility] Performed at [facility], [address] [place], [place] [address]   Urinalysis, Complete w Microscopic     Status: Abnormal   Collection Time: 03/13/18  2:31 AM  Result Value Ref Range   Color, Urine AMBER (A) YELLOW    Comment: BIOCHEMICALS MAY BE AFFECTED BY COLOR   APPearance HAZY (A) CLEAR   Specific Gravity, Urine 1.012 1.005 - 1.030   pH 5.0 5.0 - 8.0   Glucose, UA NEGATIVE NEGATIVE mg/dL   Hgb urine dipstick MODERATE  (A) NEGATIVE   Bilirubin Urine NEGATIVE NEGATIVE   Ketones, ur NEGATIVE NEGATIVE mg/dL   Protein, ur NEGATIVE NEGATIVE mg/dL   Nitrite NEGATIVE NEGATIVE   Leukocytes, UA NEGATIVE NEGATIVE   RBC / HPF 6-10 0 - 5 RBC/hpf   WBC, UA 0-5 0 - 5 WBC/hpf   Bacteria, UA RARE (A) NONE SEEN   Squamous Epithelial / LPF 0-5 0 - 5   Budding Yeast PRESENT     Comment: Performed at [facility], [address], [place], [place] [address]   Ct Head Wo Contrast  Result Date: 03/13/2018 CLINICAL DATA:  Found down. RIGHT leg bruising. History of atrial fibrillation. EXAM: CT HEAD WITHOUT CONTRAST CT CERVICAL SPINE WITHOUT CONTRAST TECHNIQUE: Multidetector CT imaging of the head and cervical spine was performed following the standard protocol without intravenous contrast. Multiplanar CT image reconstructions of the cervical spine were also generated. COMPARISON:  None. FINDINGS: CT HEAD FINDINGS-mild motion degraded examination. BRAIN: No intraparenchymal hemorrhage, mass effect nor midline shift. The ventricles and sulci are normal for age. Patchy supratentorial white matter hypodensities. No acute large vascular territory infarcts. No abnormal extra-axial fluid collections. Basal cisterns are patent. VASCULAR: Mild-to-moderate calcific atherosclerosis of the carotid siphons. SKULL: No skull fracture. No significant scalp soft tissue swelling. SINUSES/ORBITS: Trace paranasal sinus mucosal thickening. Minimal LEFT mastoid effusion. Included ocular globes and orbital contents are non-suspicious. Status post bilateral ocular lens implants. Punctate LEFT ocular globe anterior chain burr foreign body most compatible with glaucoma drainage device. OTHER: None. CT CERVICAL SPINE FINDINGS ALIGNMENT: Straightened cervical lordosis. Grade 1 C3-4 through C5-6 and C7-T1 anterolisthesis. SKULL BASE AND VERTEBRAE: Vertebral bodies intact. Multilevel severe degenerative discs and severe facet arthropathy. C1-2 articulation  maintained with severe arthropathy. RIGHT atlantooccipital arthrodesis. Old LEFT second rib fracture with callus. No destructive bony lesions. SOFT TISSUES AND SPINAL CANAL: Nonacute. Heterogeneous thyroid. Moderate calcific atherosclerosis carotid bifurcations. DISC LEVELS: Severe canal stenosis C3-4 and C5-6. Severe neural foraminal narrowing all cervical levels. UPPER CHEST: Lung apices are clear. Pacemaker leads via LEFT subclavian venous approach. OTHER: None. IMPRESSION: CT HEAD: 1. No acute intracranial process. 2. Moderate parenchymal brain volume loss and moderate chronic small vessel ischemic changes. CT cervical spine: 1. No fracture.  Multilevel spondylolisthesis  on degenerative basis. 2. Severe canal stenosis C3-4 and C5-6. Severe neural foraminal narrowing all levels. Electronically Signed   By: Awilda Metro M.D.   On: 03/13/2018 03:37   Ct Cervical Spine Wo Contrast  Result Date: 03/13/2018 CLINICAL DATA:  Found down. RIGHT leg bruising. History of atrial fibrillation. EXAM: CT HEAD WITHOUT CONTRAST CT CERVICAL SPINE WITHOUT CONTRAST TECHNIQUE: Multidetector CT imaging of the head and cervical spine was performed following the standard protocol without intravenous contrast. Multiplanar CT image reconstructions of the cervical spine were also generated. COMPARISON:  None. FINDINGS: CT HEAD FINDINGS-mild motion degraded examination. BRAIN: No intraparenchymal hemorrhage, mass effect nor midline shift. The ventricles and sulci are normal for age. Patchy supratentorial white matter hypodensities. No acute large vascular territory infarcts. No abnormal extra-axial fluid collections. Basal cisterns are patent. VASCULAR: Mild-to-moderate calcific atherosclerosis of the carotid siphons. SKULL: No skull fracture. No significant scalp soft tissue swelling. SINUSES/ORBITS: Trace paranasal sinus mucosal thickening. Minimal LEFT mastoid effusion. Included ocular globes and orbital contents are  non-suspicious. Status post bilateral ocular lens implants. Punctate LEFT ocular globe anterior chain burr foreign body most compatible with glaucoma drainage device. OTHER: None. CT CERVICAL SPINE FINDINGS ALIGNMENT: Straightened cervical lordosis. Grade 1 C3-4 through C5-6 and C7-T1 anterolisthesis. SKULL BASE AND VERTEBRAE: Vertebral bodies intact. Multilevel severe degenerative discs and severe facet arthropathy. C1-2 articulation maintained with severe arthropathy. RIGHT atlantooccipital arthrodesis. Old LEFT second rib fracture with callus. No destructive bony lesions. SOFT TISSUES AND SPINAL CANAL: Nonacute. Heterogeneous thyroid. Moderate calcific atherosclerosis carotid bifurcations. DISC LEVELS: Severe canal stenosis C3-4 and C5-6. Severe neural foraminal narrowing all cervical levels. UPPER CHEST: Lung apices are clear. Pacemaker leads via LEFT subclavian venous approach. OTHER: None. IMPRESSION: CT HEAD: 1. No acute intracranial process. 2. Moderate parenchymal brain volume loss and moderate chronic small vessel ischemic changes. CT cervical spine: 1. No fracture.  Multilevel spondylolisthesis on degenerative basis. 2. Severe canal stenosis C3-4 and C5-6. Severe neural foraminal narrowing all levels. Electronically Signed   By: Awilda Metro M.D.   On: 03/13/2018 03:37   Ct Pelvis Wo Contrast  Result Date: 03/13/2018 CLINICAL DATA:  Post fall with left hip pain. EXAM: CT PELVIS WITHOUT CONTRAST TECHNIQUE: Multidetector CT imaging of the pelvis was performed following the standard protocol without intravenous contrast. COMPARISON:  Radiographs earlier this day. FINDINGS: Urinary Tract: Distal ureters are decompressed. Urinary bladder is distended without wall thickening. Bowel: Minimal colonic diverticulosis. Pelvic bowel loops are unremarkable. Normal appendix. Vascular/Lymphatic: Mild aorta bi-iliac atherosclerosis. No adenopathy. Reproductive: Uterine calcifications likely fibroids and  vascular. 2.5 cm cystic structure in the left adnexa. Other:  No pelvic free fluid. Musculoskeletal: No pelvic fracture. Particularly, no fracture of the left inferior pubic ramus as questioned on radiograph. Moderate to advanced bilateral hip osteoarthritis. Pubic symphysis is congruent. Mild degenerative change of the sacroiliac joints. Prominent degenerative change in the lower lumbar spine. IMPRESSION: 1. No pelvic fracture. Particularly, no left inferior pubic ramus fracture is questioned on radiograph. 2. Moderate to advanced bilateral hip osteoarthritis. 3. A 2.5 cm cystic structure in the left adnexa is most likely benign, given size less than 3 cm and late postmenopausal status, no dedicated imaging follow-up is needed. Electronically Signed   By: Narda Rutherford M.D.   On: 03/13/2018 03:53   Dg Chest Port 1 View  Result Date: 03/13/2018 CLINICAL DATA:  Fall. EXAM: PORTABLE CHEST 1 VIEW COMPARISON:  None. FINDINGS: Left-sided pacemaker in place. The heart is enlarged. There is atherosclerosis of the  thoracic aorta. No pneumothorax, focal airspace disease, pulmonary edema or pleural effusion. No acute osseous abnormalities are seen. IMPRESSION: Cardiomegaly.  No evidence of acute traumatic injury. Electronically Signed   By: Narda Rutherford M.D.   On: 03/13/2018 03:22   Dg Hip Unilat W Or Wo Pelvis 2-3 Views Left  Result Date: 03/13/2018 CLINICAL DATA:  Post fall with left hip pain. EXAM: DG HIP (WITH OR WITHOUT PELVIS) 2-3V LEFT COMPARISON:  None. FINDINGS: Possible but not definite nondisplaced left inferior ramus fracture. No other fracture of the pelvis. Pubic symphysis and sacroiliac joints are congruent. Moderate to advanced bilateral hip osteoarthritis. Both femoral heads are well-seated in the respective acetabula. IMPRESSION: 1. Possible but not definite nondisplaced left inferior ramus fracture. 2. Moderate to advanced bilateral hip osteoarthritis. Electronically Signed   By: Narda Rutherford M.D.   On: 03/13/2018 03:20    Pending Labs Unresulted Labs (From admission, onward)    Start     Ordered   Signed and Held  TSH  Add-on,   R     Signed and Held          Vitals/Pain Today's Vitals   03/13/18 0430 03/13/18 0500 03/13/18 0535 03/13/18 0602  BP: 110/72 (!) 104/92 95/85 104/89  Pulse:   (!) 110 (!) 111  Resp: (!) 27 (!) 21 (!) 23 19  Temp:      TempSrc:      SpO2:   96% 96%  Weight:      Height:        Isolation Precautions No active isolations  Medications Medications  diltiazem (CARDIZEM) 100 mg in dextrose 5% (1 mg/mL) infusion (7 mg/hr Intravenous Rate/Dose Change 03/13/18 0535)  Rivaroxaban (XARELTO) tablet 15 mg (has no administration in time range)  diltiazem (CARDIZEM) injection 10 mg (10 mg Intravenous Given 03/13/18 0243)  morphine 2 MG/ML injection 2 mg (2 mg Intravenous Given 03/13/18 0356)  diltiazem (CARDIZEM) injection 10 mg (10 mg Intravenous Given 03/13/18 0417)    Mobility non-ambulatory

## 2018-03-13 NOTE — ED Notes (Signed)
Pt is on chronic 2 L oxygen. Pt found with large amount of old stool on bottom. Pt's vaginal area red and raw at this time.

## 2018-03-13 NOTE — Progress Notes (Signed)
ANTICOAGULATION CONSULT NOTE - Initial Consult  Pharmacy Consult for Xarelto dosing Indication: atrial fibrillation  Allergies  Allergen Reactions  . Eliquis [Apixaban]   . Lisinopril   . Neosporin [Neomycin-Bacitracin Zn-Polymyx]   . Penicillins   . Sulfa Antibiotics     Patient Measurements: Height: 5\' 5"  (165.1 cm) Weight: 185 lb (83.9 kg) IBW/kg (Calculated) : 57 Heparin Dosing Weight: n/a  Vital Signs: Temp: 97.4 F (36.3 C) (12/16 0304) Temp Source: Rectal (12/16 0304) BP: 95/85 (12/16 0535) Pulse Rate: 110 (12/16 0535)  Labs: Recent Labs    03/13/18 0231  HGB 10.5*  HCT 34.9*  PLT 295  CREATININE 1.86*  TROPONINI 0.06*    Estimated Creatinine Clearance: 25 mL/min (A) (by C-G formula based on SCr of 1.86 mg/dL (H)).   Medical History: Past Medical History:  Diagnosis Date  . Anemia   . Arthritis   . Atrial fibrillation (HCC)   . CHF (congestive heart failure) (HCC)   . Depression   . Dysphasia   . GERD (gastroesophageal reflux disease)     Medications:    Assessment:  Goal of Therapy:      Plan:  Xarelto 15 mg daily ordered.  Cindy Hogan S 03/13/2018,5:55 AM

## 2018-03-13 NOTE — ED Triage Notes (Signed)
Pt arrives via [facility] from the Rehab area of [facility]. Pt was found on her left hip with her oxygen concentrator off. Per EMS, facility unable to answer whether pt is on anticoagulants or how she ambulates. Pt has old bruising on her right leg from unknown event that facility was not able to tell EMS. Pt is talking at this time to MD Manson Passey.

## 2018-03-13 NOTE — Consult Note (Addendum)
WOC Nurse wound consult note Reason for Consult: Consult requested for right hand Wound type: Full thickness abrasion/skin tear Measurement: 3.5X2X.1cm Wound bed: skin approximated over 50% of the wound bed; 50% red  Drainage (amount, consistency, odor) small amt tan drainage, no odor or bleeding Periwound: bruising surrounding the location Dressing procedure/placement/frequency: Xeroform gauze to protect and promote healing. No family at the bedside to discuss plan of care and patient appears to be confused. Please re-consult if further assistance is needed.  Thank-you,  Cammie Mcgee MSN, RN, CWOCN, [place], CNS [phone]

## 2018-03-13 NOTE — Progress Notes (Signed)
Patient has been alert but intermittently confused, however pleasant. She is hard of hearing and is usually able to be re-oriented. Patient was able to use bedpan with prompting today, follows commands well. Spoke with daughter on phone earlier this morning.

## 2018-03-14 ENCOUNTER — Inpatient Hospital Stay
Admit: 2018-03-14 | Discharge: 2018-03-14 | Disposition: A | Discharge status: Home / Self Care | Payer: Medicare HMO | Attending: Internal Medicine | Admitting: Internal Medicine

## 2018-03-14 ENCOUNTER — Encounter: Payer: Self-pay | Admitting: Physician Assistant

## 2018-03-14 DIAGNOSIS — R634 Abnormal weight loss: Secondary | ICD-10-CM

## 2018-03-14 DIAGNOSIS — Z515 Encounter for palliative care: Secondary | ICD-10-CM

## 2018-03-14 DIAGNOSIS — I4821 Permanent atrial fibrillation: Principal | ICD-10-CM

## 2018-03-14 LAB — CBC WITH DIFFERENTIAL/PLATELET
Abs Immature Granulocytes: 0.08 10*3/uL — ABNORMAL HIGH (ref 0.00–0.07)
Basophils Absolute: 0 10*3/uL (ref 0.0–0.1)
Basophils Relative: 0 %
Eosinophils Absolute: 0 10*3/uL (ref 0.0–0.5)
Eosinophils Relative: 0 %
HCT: 28.6 % — ABNORMAL LOW (ref 36.0–46.0)
Hemoglobin: 8.4 g/dL — ABNORMAL LOW (ref 12.0–15.0)
Immature Granulocytes: 2 %
Lymphocytes Relative: 14 %
Lymphs Abs: 0.6 10*3/uL — ABNORMAL LOW (ref 0.7–4.0)
MCH: 28.3 pg (ref 26.0–34.0)
MCHC: 29.4 g/dL — ABNORMAL LOW (ref 30.0–36.0)
MCV: 96.3 fL (ref 80.0–100.0)
MONO ABS: 0.7 10*3/uL (ref 0.1–1.0)
Monocytes Relative: 16 %
Neutro Abs: 2.8 10*3/uL (ref 1.7–7.7)
Neutrophils Relative %: 68 %
Platelets: 295 10*3/uL (ref 150–400)
RBC: 2.97 MIL/uL — ABNORMAL LOW (ref 3.87–5.11)
RDW: 22.6 % — ABNORMAL HIGH (ref 11.5–15.5)
Smear Review: NORMAL
WBC: 4.5 10*3/uL (ref 4.0–10.5)
nRBC: 0.7 % — ABNORMAL HIGH (ref 0.0–0.2)

## 2018-03-14 LAB — ECHOCARDIOGRAM COMPLETE
Height: 65 in
Weight: 2193.6 oz

## 2018-03-14 LAB — BASIC METABOLIC PANEL
Anion gap: 13 (ref 5–15)
BUN: 35 mg/dL — AB (ref 8–23)
CO2: 22 mmol/L (ref 22–32)
Calcium: 8.4 mg/dL — ABNORMAL LOW (ref 8.9–10.3)
Chloride: 99 mmol/L (ref 98–111)
Creatinine, Ser: 1.7 mg/dL — ABNORMAL HIGH (ref 0.44–1.00)
GFR calc Af Amer: 32 mL/min — ABNORMAL LOW (ref >60)
GFR calc non Af Amer: 28 mL/min — ABNORMAL LOW (ref >60)
Glucose, Bld: 113 mg/dL — ABNORMAL HIGH (ref 70–99)
Potassium: 5.1 mmol/L (ref 3.5–5.1)
Sodium: 134 mmol/L — ABNORMAL LOW (ref 135–145)

## 2018-03-14 LAB — HEMOGLOBIN A1C
HEMOGLOBIN A1C: 6.7 % — AB (ref 4.8–5.6)
Mean Plasma Glucose: 145.59 mg/dL

## 2018-03-14 LAB — POTASSIUM: Potassium: 4.4 mmol/L (ref 3.5–5.1)

## 2018-03-14 LAB — MAGNESIUM: Magnesium: 1.9 mg/dL (ref 1.7–2.4)

## 2018-03-14 MED ORDER — SODIUM CHLORIDE 0.9 % IV SOLN
INTRAVENOUS | Status: DC
  Administered 2018-03-14 (×2): via INTRAVENOUS

## 2018-03-14 MED ORDER — METOPROLOL TARTRATE 50 MG PO TABS
50.0000 mg | ORAL_TABLET | Freq: Two times a day (BID) | ORAL | Status: DC
  Filled 2018-03-14 (×2): qty 1

## 2018-03-14 NOTE — Progress Notes (Signed)
Advanced Care Plan.  Purpose of Encounter: Palliative care. Parties in Attendance: The patient, her daughter and me. Patient's Decisional Capacity: No. Medical Story: Cindy Hogan is an 84 y.o. female with history of CHF, A. fib, anemia, arthritis, GERD and depression.  The patient is admitted for A. fib with RVR with elevated troponin, acute renal failure due to dehydration.  I discussed with the patient daughter about her current condition, poor prognosis and possible palliative care or hospice care.  She is interested in palliative care and agreed to get palliative care consult. Goals of Care Determinations: Palliative care. Plan:  Code Status: DNR. Time spent discussing advance care planning: 18 minutes.

## 2018-03-14 NOTE — Progress Notes (Signed)
Wound care completed at this time to R hand area, as ordered daily. Old dressing removed, new applied with Xeroform and 1 2x2 gauze pad underneath. Patient tolerated well. Supplies left on counter for the next RN. Will pass along in shift report. Jari Favre [facility]

## 2018-03-14 NOTE — Progress Notes (Addendum)
    Spoke with daughter in law, Jamesetta So, today and obtained location and contact information for previous medical care.   Called and obtained medical record number of patient from [facility] healthcare's medical records department. Requested outside records -now present in chart.   Verified PPM as [employer] and implanted for SSS. Spoke with [employer] representative regarding interrogation of [employer] device to take place later today with paperwork placed in patient chart.    Per daughter in law, patient has history of frequent falls. Daughter stated her suspicion that this most recent fall was likely due to "rolling out of bed," as patient reportedly has a history of falling out of bed after turning over. Daughter reported she is in discussion with [facility] social worker, Minerva Areola, regarding palliative care meeting to discuss goals of care.   Signed, Lennon Alstrom, PA-C 03/14/2018, 12:18 PM Pager [phone]

## 2018-03-14 NOTE — Clinical Social Work Note (Signed)
CSW received phone call from Misty Stanley [phone] the RN at [facility], she requested some clinical information on how patient is doing.  Misty Stanley had asked if palliative can meet with patient to see if she is appropriate for hospice services in the ALF.  Misty Stanley also said that patient was using [facility] for home health before she went to [facility], CSW updated case Production designer, theatre/television/film.  CSW spoke with patient's daughter she would still prefer patient return back to ALF.  She requested EMS transport once patient is ready for discharge, because ALF will not come to the hospital to pick up patient.  Patient's daughter would like to speak with palliative to discuss goals of care and to see if patient needs hospice services to follow her since she has a decline in health.  Daughter Cindy Hogan requests that palliative call her to have a conference call.  CSW to continue to follow patient's progress throughout discharge planning.  Cindy Hogan. Cindy Hogan, MSW, [facility]resia Majors [phone]  03/14/2018 12:07 PM

## 2018-03-14 NOTE — Progress Notes (Signed)
*  PRELIMINARY RESULTS* Echocardiogram 2D Echocardiogram has been performed.  Cindy Hogan 03/14/2018, 9:21 AM

## 2018-03-14 NOTE — Consult Note (Signed)
Consultation Note Date: 03/15/2018   Patient Name: Cindy Hogan  DOB: 11-25-1933  MRN: [id number]  Age / Sex: 84 y.o., female  PCP: Juluis Pitch, MD Referring Physician: Demetrios Loll, MD  Reason for Consultation: Establishing goals of care  HPI/Patient Profile: 84 y.o. female  with past medical history of frequent falls, heart failure (EF 55% with pulmonary HTN), rheumatoid arthritis, and pacemaker placement who was admitted on 03/13/2018 with a fall and was found to have atrial fibrillation with RVR, hypotension and acute renal failure.  She has no previous medical records in our Epic system.   She had a hospitalization at [facility] earlier this month.  Clinical Assessment and Goals of Care:  I have reviewed medical records including EPIC notes, labs and imaging, received report from Dr. Bridgett Larsson, assessed the patient and then met at the bedside along with her daughter Silva Bandy, and her son Gershon Mussel was on the phone, to discuss diagnosis prognosis, GOC, EOL wishes, disposition and options.  I introduced Palliative Medicine as specialized medical care for people living with serious illness. It focuses on providing relief from the symptoms and stress of a serious illness. The goal is to improve quality of life for both the patient and the family.  We discussed a brief life review of the patient.  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  She was disabled in 1976 with rheumatoid arthritis.  She married Phyllis's father when Silva Bandy was 42 years old.  Since her husband passed 18 months ago Silva Bandy and Gershon Mussel have become Pat's primary caregivers.  Silva Bandy is her healthcare power of attorney.  As far as functional and nutritional status she is primarily bedbound.  She has had frequent falls.  But when she falls she rolls out of bed.  Just in the last several months she has become fully dependent for all ADLs.  She cannot  feed herself.  She has intermittent confusion.  She appears to have some underlying dementia.  Her desire to eat and drink waxes and wanes.  Today she was spitting out medications and refusing to eat or drink.  We discussed her current illness and what it means in the larger context of her on-going co-morbidities.  Natural disease trajectory and expectations at EOL were discussed.  I attempted to elicit values and goals of care important to the patient.  Fraser Din tells me that she prioritizes comfort and quality over longevity.  She prefers not to return to the hospital but rather to receive medical care in place as needed.  She does not desire for her life to be prolonged.  The difference between aggressive medical intervention and comfort care was considered in light of the patient's goals of care.  Advanced directives, concepts specific to code status, artifical feeding and hydration, and rehospitalization were considered and discussed. A MOST form was completed with her son and daughter -the following choices were made: 1.  DNR 2.  Comfort measures only, do not return to the hospital unless the patient cannot be  kept comfortable in her current location 3.  Determine the need for antibiotics when infection occurs.  Antibiotics are to be used as a comfort measure. 4.  No IV fluids (we discussed the cycle of not eating and drinking, dehydration, hospitalization-the family does not want that) 5.  No feeding tube or artificial feeding.  Hospice and Palliative Care services outpatient were explained and offered.  The patient's son Gershon Mussel worked as an Optometrist with hospice for a period of time.  The family thinks very highly of hospice services.  They would like to engage hospice services at ALF to assist with care of Pat.  Questions and concerns were addressed.  Hard Choices booklet left for review. The family was encouraged to call with questions or concerns.     Primary Decision Maker:  Ulyess Blossom.   However both tOM and the patient were engaged in the conversation as well.    SUMMARY OF RECOMMENDATIONS   1.  Most form completed.  Family opts for comfort measures and hospice services. 2.  Discharge to ALF with hospice services when medically ready. 3.  Minimize medications.  Daily aspirin, statin, multivitamin have been discontinued but maintenance medications such as antidepressants, methotrexate, metoprolol will be continued as the patient has a prognosis of up to 6 months. 4.  Agree with discontinuation of anticoagulation as the patient has frequent falls  Code Status/Advance Care Planning:  DNR  Symptom Management:   Agree with Tylenol Extra Strength for pain  Additional Recommendations (Limitations, Scope, Preferences):  Avoid Hospitalization, Full Comfort Care, No Artificial Feeding, No IV Antibiotics and No IV Fluids  Psycho-social/Spiritual:   Desire for further Chaplaincy support: Yes, requested  Prognosis: Less than 6 months given 2 hospitalizations in quick succession, rapid decline, significant weight loss, minimal p.o. intake, bedbound status, family and patient's desires for comfort measures only.  Patient does not want to return to the hospital.    Discharge Planning: Assisted living facility with hospice      Primary Diagnoses: Present on Admission: . Atrial fibrillation with RVR ([place])   I have reviewed the medical record, interviewed the patient and family, and examined the patient. The following aspects are pertinent.  Past Medical History:  Diagnosis Date  . Anemia   . Anxiety   . Arthritis   . Atrial fibrillation ([place])    on chronic anticoagulation with Xarelto  . Depression   . Diabetes ([place])    Pending updated medical records and A1C with stepdaughter reporting history of DM2 on medications  . Diastolic heart failure ([place])    12/17 Echo: EF normal. PA peak pressure: 59 mm Hg   . Dysphasia   . Frequent falls   . GERD (gastroesophageal  reflux disease)   . Pacemaker    [employer]onic   Social History   Socioeconomic History  . Marital status: Widowed    Spouse name: Not on file  . Number of children: Not on file  . Years of education: Not on file  . Highest education level: Not on file  Occupational History  . Not on file  Social Needs  . Financial resource strain: Not on file  . Food insecurity:    Worry: Not on file    Inability: Not on file  . Transportation needs:    Medical: Not on file    Non-medical: Not on file  Tobacco Use  . Smoking status: Unknown If Ever Smoked  Substance and Sexual Activity  . Alcohol use: Not Currently  .  Drug use: Not Currently  . Sexual activity: Not on file  Lifestyle  . Physical activity:    Days per week: Not on file    Minutes per session: Not on file  . Stress: Not on file  Relationships  . Social connections:    Talks on phone: Not on file    Gets together: Not on file    Attends religious service: Not on file    Active member of club or organization: Not on file    Attends meetings of clubs or organizations: Not on file    Relationship status: Not on file  Other Topics Concern  . Not on file  Social History Narrative  . Not on file   No family history on file. Scheduled Meds: . docusate sodium  100 mg Oral BID  . DULoxetine  30 mg Oral Daily  . escitalopram  5 mg Oral Daily  . ferrous sulfate  325 mg Oral Daily  . fluconazole  100 mg Oral Daily  . folic acid  1 mg Oral Daily  . mouth rinse  15 mL Mouth Rinse BID  . [START ON 03/16/2018] methotrexate  12.5 mg Oral Q Thu  . metoprolol tartrate  50 mg Oral BID  . multivitamin with minerals  1 tablet Oral Daily  . nystatin  5 mL Oral QID  . pantoprazole  40 mg Oral Daily  . polyethylene glycol  17 g Oral Daily  . predniSONE  7.5 mg Oral Daily  . rivaroxaban  15 mg Oral Daily  . senna-docusate  2 tablet Oral QHS  . vitamin B-12  1,000 mcg Oral Daily   Continuous Infusions: . sodium chloride 75 mL/hr at  03/14/18 0949   PRN Meds:.acetaminophen **OR** acetaminophen, bisacodyl, ondansetron **OR** ondansetron (ZOFRAN) IV Allergies  Allergen Reactions  . Eliquis [Apixaban]   . Lisinopril   . Neosporin [Neomycin-Bacitracin Zn-Polymyx]   . Penicillins   . Sulfa Antibiotics    Review of Systems patient complains of generalized pain and discomfort in her neck, constipation.  She denies shortness of breath, chest pain, hunger.  Physical Exam  Well-developed, elderly female with poor dentition, she is very hard of hearing and requires hearing aids Respiratory clear to auscultation with no increased work of breathing Cardiovascular irregular rate and rhythm with systolic murmur 3/6 Abdomen soft, nontender, nondistended Lower extremities with 1+ edema  Vital Signs: BP (!) 100/49 (BP Location: Left Arm)   Pulse 76   Temp (!) 97.3 F (36.3 C) (Oral)   Resp 18   Ht _0  (1.651 m)   Wt 62.2 kg   SpO2 90%   BMI 22.81 kg/m  Pain Scale: 0-10   Pain Score: 0-No pain   SpO2: SpO2: 90 % O2 Device:SpO2: 90 % O2 Flow Rate: .O2 Flow Rate (L/min): 2 L/min  IO: Intake/output summary:   Intake/Output Summary (Last 24 hours) at 03/14/2018 1447 Last data filed at 03/14/2018 0404 Gross per 24 hour  Intake 416.67 ml  Output 500 ml  Net -83.33 ml    LBM: Last BM Date: (unsure) Baseline Weight: Weight: 83.9 kg Most recent weight: Weight: 62.2 kg     Palliative Assessment/Data: 30%     Time In: 2:00 Time Out: 3: 10 Time Total: 70 minutes Greater than 50%  of this time was spent counseling and coordinating care related to the above assessment and plan.  Signed by: Florentina Jenny, PA-C Palliative Medicine Pager: [phone]  Please contact Palliative Medicine Team phone at [phone]  for questions and concerns.  For individual provider: See Shea Evans

## 2018-03-14 NOTE — Plan of Care (Signed)
  Problem: Clinical Measurements: Goal: Ability to maintain clinical measurements within normal limits will improve Outcome: Not Progressing Note:  Patient for an ECHO today. Patient's B.P. = only 89 systolic this AM. Normal saline fluids restarted at 75 cc / hr. Also, palliative care consulted today to assist with G.O.C. and treatment course. Patient intermittently confused throughout shift (worse this AM). Will continue to monitor overall status. Jari Favre [facility]

## 2018-03-14 NOTE — Consult Note (Addendum)
Cardiology Consultation:   Patient ID: Junell Cullifer MRN: [id number]; DOB: 30-Dec-1933  Admit date: 03/13/2018 Date of Consult: 03/14/2018  Primary Care Provider: Clover Mealy, PA Primary Cardiologist:[facility], Dr. Okey Dupre Primary Electrophysiologist:  None ; Dr. Sheran Luz   Patient Profile:   Karine Garn is a 84 y.o. female with a hx of hypertensive heart disease / ICM, chronic diastolic heart failure (Ef 55-65%), h/o SSS with subsequent Medronic dual chamber pacemaker implantation, permanent atrial fibrillation on anticoagulation, anemia, DM2 not on medication (11/7 A1C 7.0), venous stasis dermatitis, rheumatoid arthritis, osteoporosis, glaucoma, bilateral hearing loss with hearing aids, anxiety, depression, and GERD who is being seen today for the evaluation of Atrial Fibrillation at the request of Dr. Imogene Burn.  History of Present Illness:   Ms. Mishkin is an 84 year old female with past medical history as above.  Patient is status post Medtronic dual-chamber pacemaker implantation with remote pacemaker check performed every 90 days for tachybradycardia syndrome.  Patient denied history of smoking and no current alcohol or drug use.  She is originally from [place] and moved to [place] in 2000. Patient lived in ALF with husband until he passed away, approximately 1 year ago, and has since moved several times between SNF's, her previous apartment, and ALF's.  She now lives at [facility]F at full-time status. She has a documented history of anxiety and depression requiring IVC and approximately 1 month stay on [facility] unit. Her anxiety is reportedly worse with shortness of breath.Of note reported at allergies and intolerances include Eliquis and lisinopril.   09/2017: Seen by Dr. Elvin So of [facility] geriatric specialty clinic on 09/27/2017 for geriatric follow-up and evaluation and after recent hospitalization for atrial fibrillation with rapid ventricular response, heart failure  exacerbation, and AKI.  Per these progress notes, patient presented from [facility]. She was noted to be rate controlled in atrial fibrillation and on Xarelto, diltiazem 120 mg daily, and metoprolol 75 mg twice daily. Her Lasix had reportedly been titrated at [facility] with patient now taking lasix 40 mg in the morning and 20 mg at [facility] with ongoing diuresis and associated weight loss and improved symptoms. Patient's was noted to have intact memory without any recent falls and was reportedly successfully ambulating with her walker and completing physical therapy. Of note, the patient did feel she had increased erythema and warmth in her lower extremities, worse in her left lower extremity. She had been placed antibiotics recently for a wound on her second toe of her left foot that appeared clean but slow healing with known history of venous stasis dermatitis. Patient was instructed to keep wound clean until her podiatrist appointment the next week.  11/2017: Medtronic interrogation check performed 12/12/2017 by Dr. Sheran Luz -->normal device function and tested lead measurements were documented is stable and within normal range with adequate battery and no significant new ventricular arrhythmias and 1 episode AF / 100% AF burden on Xarelto.   01/2018: Seen at [facility] emergency department 11/1 after falling and hitting her head. She was noted to have fallen out of her chair at her nursing home and onto her face but without reported LOC. She presented mildly tachycardic at ventricular rate of 109.  She was documented to have left forehead hematoma but otherwise no signs of trauma.  CT head was negative.  EKG showed A. fib with ventricular rate of 56, QRS 132, QTc 469, & right bundle branch block -->no significant changes from prior EKGs. X-ray of pelvis showed  unchanged severe right and moderate left hip osteoarthrosis and chest x-ray showed streaky bibasilar opacities, presumed likely  atelectasis. Labs obtained 02/02/2018 showed hemoglobin 10.1, RBC 3.47, hematocrit 31.9.  Hemoglobin A1c 7.0 and not on DM2 medications. Patient was ambulated and discharged. On 02/10/2018, patient was again admitted to [facility] with primary complaint of dyspnea, weakness, and fear of falling after her recent fall at home. On admission, she was hypoxic and was noted to require 2 to 4 L nasal cannula. She was found to have mild pulmonary vascular congestion on chest x-ray with diffuse bilateral crackles on exam.  Diuresis was initiated with IV Lasix 40 mg twice daily and increase  to IV Lasix 80 mg for several days then  transitioned to p.o. Lasix 40 mg BID on 11/14.  Weight was documented as improved from 75.4 kg to 74.9 kg (bed weight) at time of discharge.  TTE showed normal EF, mildly decreased persistent right ventricular systolic function, and improved but elevated PASP (50 down from previous 60).  She was noted to be in atrial fibrillation with multiple missed/delayed home medications per documentation. She was also noted to have 10-20 beats of VT per transport. She developed sustained RVR on telemetry 11/10 with increasing oxygen requirement and worsening pulmonary edema on chest x-ray. Improvement in rate noted with nitroglycerin. ACS work-up was negative at that time.  Ultimately, heart rate control was increased to metoprolol 100 mg twice daily (from 75mg ), and she was continued on diltiazem and Xarelto with control of her ventricular rates at discharge. Of note, functional decline was noted since recent fall with patient reporting progressive decline in strength for many months and unsteady gait requiring transition to ALF with increasing assistance with ambulation/transfers from staff and with wheelchair. Neurologic exam was largely unremarkable, however, with strength intact and no abnormalities on cerebellar testing. Given that patient was again noted to have lower extremity edema greater in the left side  than right and with overlying erythema in the setting of chronic known venous stasis and recent SOB despite increased lasix, she was worked up for DVT with PVL negative for DVT. Lower extremity edema was noted to improve with IV diuresis and IV abx in the ED. She was discharged to [facility].   On 02/20/2018, she was seen by Cardiology Harvel Ricks, ANP) and noted to be in Afib with well controlled rate in the 80s. She denied any significant SOB, palpitations, or chest pain. Medications included diltiazem 120 mg p.o. daily, iron supplementation 325 mg tablet daily, furosemide 40 mg tablet twice daily or as needed for weight gain of 2 pounds in 1 day or 5 pounds in 1 week, and metoprolol tartrate 100 mg twice daily. It was noted that, given the patient's recent falls and dependent on her progressive rehab, reassessment might be needed for patient's anticoagulation on Xarelto.  She appeared euvolemic on exam but without a dry weight recorded as she could not stand on her own. Minimal ankle edema was documented.  She was on home oxygen, and per daughter, patient had increased anxiety.  Creatinine was 1.68 on 11/20 and increased from her discharge of Cr 1.1. She was being treated for a UTI.  Patient presented to [facility] ED 03/13/2018 from rehabilitation where she was reportedly found after a fall on her left hip with oxygen off at that time (with new home oxygen requirement as above). AMS reported. Per discussion with daughter in law, patient is known to fall when turning over bed. She reported suspicion  that patient had rolled out of bed and onto the floor.   In the ED 12/16, patient was in atrial fibrillation with RVR, mildly elevated troponin, and elevated creatinine. Also noted was anemia with hgb 10.5 and UA findings as below.  Vitals: BP 138/62, HR 80, T 90 7.16F, SPO2 95%  Labs: Sodium 130, potassium 4.3, creatinine 1.86, BUN 36, calcium 8.9, troponin 0.06, WBC 5.4, RBC 3.74, hemoglobin 10.5,  hematocrit 34.9, platelets 295, glucose 144, TSH 4.366  UA with moderate hemoglobin present on dipstick, rare bacteria and yeast present  EKG: significant for irregular rate 128 bpm QTC 511 and right bundle branch block. Poor R wave progression  CXR: Significant for left-sided pacemaker. Enlarged heart/cardiomegaly. Noted atherosclerosis of the thoracic aorta. No pneumothorax, focal airspace disease, pulmonary edema, or pleural effusion. No acute osseous abnormality seen. No evidence of acute traumatic injury  Hip imaging showed possible but not definite displaced left inferior ramus fracture. Moderate to advanced bilateral hip osteoarthritis.  Meds: IVF, diltiazem boluses then started on a drip. PTA lasix held d/t AKI and dehydration with IVF started and AM BMET recommended.   Pending echo, ordered by ED to assess EF.  On today's 12/17 exam, patient is oriented to self but does not know current location and states she is at [facility]. She is notably hard of hearing. She denied current chest pain, palpitations, or racing heart rate on today's 12/17 examination. No family was present at the time of my examination but more information was obtained on phone call with daughter in law, Jamesetta So, after examination of the patient.    Past Medical History:  Diagnosis Date  . Anemia   . Arthritis   . Atrial fibrillation (HCC)   . CHF (congestive heart failure) (HCC)   . Depression   . Dysphasia   . GERD (gastroesophageal reflux disease)     Past Surgical History:  Procedure Laterality Date  . KNEE ARTHROCENTESIS       Home Medications:  Prior to Admission medications   Medication Sig Start Date End Date Taking? Authorizing Provider  albuterol (PROVENTIL HFA;VENTOLIN HFA) 108 (90 Base) MCG/ACT inhaler Inhale 2 puffs into the lungs every 6 (six) hours as needed for wheezing or shortness of breath.   Yes [provider]  alendronate (FOSAMAX) 70 MG tablet Take 70 mg by mouth  once a week. Take with a full glass of water on an empty stomach.   Yes [provider]  diltiazem (CARDIZEM CD) 120 MG 24 hr capsule Take 120 mg by mouth daily.   Yes [provider]  DULoxetine (CYMBALTA) 30 MG capsule Take 30 mg by mouth daily.   Yes [provider]  escitalopram (LEXAPRO) 5 MG tablet Take 5 mg by mouth daily.   Yes [provider]  ferrous sulfate 325 (65 FE) MG tablet Take 325 mg by mouth daily.   Yes [provider]  folic acid (FOLVITE) 1 MG tablet Take 1 mg by mouth daily.   Yes [provider]  furosemide (LASIX) 40 MG tablet Take 80 mg by mouth 2 (two) times daily as needed (weight gain of 2lbs in 1 day or 5lbs in 1 week).   Yes [provider]  furosemide (LASIX) 40 MG tablet Take 40 mg by mouth daily.   Yes [provider]  methotrexate (RHEUMATREX) 2.5 MG tablet Take 12.5 mg by mouth every Thursday. Caution:Chemotherapy. Protect from light.   Yes [provider]  metoprolol tartrate (LOPRESSOR) 100  MG tablet Take 100 mg by mouth 2 (two) times daily.   Yes [provider]  Multiple Vitamin (MULTIVITAMIN WITH MINERALS) TABS tablet Take 1 tablet by mouth daily.   Yes [provider]  nystatin (MYCOSTATIN) 100000 UNIT/ML suspension Take 5 mLs by mouth 4 (four) times daily. 03/09/18 03/16/18 Yes [provider]  pantoprazole (PROTONIX) 40 MG tablet Take 40 mg by mouth daily.   Yes [provider]  polyethylene glycol (MIRALAX / GLYCOLAX) packet Take 17 g by mouth daily.   Yes [provider]  potassium chloride (K-DUR,KLOR-CON) 10 MEQ tablet Take 10 mEq by mouth daily.   Yes [provider]  predniSONE (DELTASONE) 2.5 MG tablet Take 7.5 mg by mouth daily.   Yes [provider]  Rivaroxaban (XARELTO) 15 MG TABS tablet Take 15 mg by mouth daily at 6 PM.   Yes [provider]  senna-docusate (SENOKOT-S) 8.6-50 MG tablet Take 2  tablets by mouth at bedtime.   Yes [provider]  vitamin B-12 (CYANOCOBALAMIN) 1000 MCG tablet Take 1,000 mcg by mouth daily.   Yes [provider]    Inpatient Medications: Scheduled Meds: . diltiazem  120 mg Oral Daily  . docusate sodium  100 mg Oral BID  . DULoxetine  30 mg Oral Daily  . escitalopram  5 mg Oral Daily  . ferrous sulfate  325 mg Oral Daily  . fluconazole  100 mg Oral Daily  . folic acid  1 mg Oral Daily  . mouth rinse  15 mL Mouth Rinse BID  . [START ON 03/16/2018] methotrexate  12.5 mg Oral Q Thu  . metoprolol tartrate  50 mg Oral BID  . multivitamin with minerals  1 tablet Oral Daily  . nystatin  5 mL Oral QID  . pantoprazole  40 mg Oral Daily  . polyethylene glycol  17 g Oral Daily  . potassium chloride  10 mEq Oral Daily  . predniSONE  7.5 mg Oral Daily  . rivaroxaban  15 mg Oral Daily  . senna-docusate  2 tablet Oral QHS  . vitamin B-12  1,000 mcg Oral Daily   Continuous Infusions:  PRN Meds: acetaminophen **OR** acetaminophen, bisacodyl, ondansetron **OR** ondansetron (ZOFRAN) IV  Allergies:    Allergies  Allergen Reactions  . Eliquis [Apixaban]   . Lisinopril   . Neosporin [Neomycin-Bacitracin Zn-Polymyx]   . Penicillins   . Sulfa Antibiotics     Social History:   Social History   Socioeconomic History  . Marital status: Widowed    Spouse name: Not on file  . Number of children: Not on file  . Years of education: Not on file  . Highest education level: Not on file  Occupational History  . Not on file  Social Needs  . Financial resource strain: Not on file  . Food insecurity:    Worry: Not on file    Inability: Not on file  . Transportation needs:    Medical: Not on file    Non-medical: Not on file  Tobacco Use  . Smoking status: Unknown If Ever Smoked  Substance and Sexual Activity  . Alcohol use: Not Currently  . Drug use: Not Currently  . Sexual activity: Not on file  Lifestyle  . Physical activity:     Days per week: Not on file    Minutes per session: Not on file  . Stress: Not on file  Relationships  . Social connections:    Talks on phone: Not on  file    Gets together: Not on file    Attends religious service: Not on file    Active member of club or organization: Not on file    Attends meetings of clubs or organizations: Not on file    Relationship status: Not on file  . Intimate partner violence:    Fear of current or ex partner: Not on file    Emotionally abused: Not on file    Physically abused: Not on file    Forced sexual activity: Not on file  Other Topics Concern  . Not on file  Social History Narrative  . Not on file    Family History:   No family history on file.   ROS:  Please see the history of present illness.  Review of Systems  Constitutional: Positive for weight loss.       Compared to previous admission documented weight  HENT: Positive for hearing loss.        Hearing aids documented  Respiratory: Negative for shortness of breath.        On home oxygen  Cardiovascular: Negative for chest pain, palpitations and orthopnea.       Patient denied cardiac symptoms with consideration of AMS at this time   Musculoskeletal: Positive for falls.       Documented falls  Neurological: Positive for weakness.       AMS. Unknown if LOC at time of fall Instability documented in previous workups with known h/o falls  Psychiatric/Behavioral: Positive for depression and memory loss. The patient is nervous/anxious.        AMS, anxiety, depression   All other ROS reviewed and negative.     Physical Exam/Data:   Vitals:   03/13/18 1641 03/13/18 2054 03/14/18 0330 03/14/18 0332  BP: (!) 92/56 99/60 (!) 91/39 98/66  Pulse: 65 72 75 67  Resp: 19     Temp: 97.6 F (36.4 C) 97.6 F (36.4 C) (!) 97.3 F (36.3 C)   TempSrc: Oral  Oral   SpO2: 100% 100% 100% 90%  Weight:    62.2 kg  Height:        Intake/Output Summary (Last 24 hours) at 03/14/2018 0746 Last data  filed at 03/14/2018 0404 Gross per 24 hour  Intake 416.67 ml  Output 750 ml  Net -333.33 ml   Filed Weights   03/13/18 0223 03/13/18 0654 03/14/18 0332  Weight: 83.9 kg 60.2 kg 62.2 kg   Body mass index is 22.81 kg/m.  General:  Frail elderly woman, anxious and uncomfortable HEENT: hard of hearing, bilateral hearing aids  Neck: no JVD Vascular: No carotid bruits; 2+ radial pulses Cardiac:  Faint heart sounds, IRIR Lungs:  clear to auscultation bilaterally Abd: soft, nontender, no hepatomegaly  Ext: minimal/trace edema Musculoskeletal:  No deformities, BUE and BLE strength normal and equal Skin: warm and dry  Neuro:  AMS Psych:  AMS, agitated  EKG:  The EKG was personally reviewed and demonstrates:  Atrial Fibrillation, ventricular rate 128 Telemetry:  Telemetry was personally reviewed and demonstrates:  Afib with ventricular rates 60s-80s  Relevant CV Studies: Study Conclusions - Left ventricle: The cavity size was normal. Systolic function was   normal. The estimated ejection fraction was in the range of 55%   to 65%. - Mitral valve: There was mild regurgitation. - Pulmonary arteries: PA peak pressure: 59 mm Hg (S).  Echo 02/07/2018  Left ventricular hypertrophy - mild  Normal left ventricular systolic function, ejection fraction > 55%  Degenerative mitral valve disease  Dilated left atrium - mild  Aortic sclerosis  Dilated right ventricle - mild  Mildly decreased right ventricular systolic function  Dilated right atrium - mild  Pericardial effusion - small  Limited study to evaluate ventricular function Technically difficult study due to chest wall/lung interference  Echo 02/06/2018  Left ventricular hypertrophy - mild  Normal left ventricular systolic function, ejection fraction > 55%  Mitral annular calcification  Degenerative mitral valve disease  Mitral regurgitation - mild  Dilated left atrium - mild  Aortic sclerosis  Aortic  regurgitation - mild  Elevated pulmonary artery systolic pressure - moderate  Dilated right ventricle - mild  Mildly decreased right ventricular systolic function  Tricuspid regurgitation - mild  Dilated right atrium - mild  Technically difficult study due to chest wall/lung interference    Laboratory Data:  Chemistry Recent Labs  Lab 03/13/18 0231 03/14/18 0549  NA 130* 134*  K 4.3 5.1  CL 92* 99  CO2 24 22  GLUCOSE 144* 113*  BUN 36* 35*  CREATININE 1.86* 1.70*  CALCIUM 8.9 8.4*  GFRNONAA 25* 28*  GFRAA 29* 32*  ANIONGAP 14 13    No results for input(s): PROT, ALBUMIN, AST, ALT, ALKPHOS, BILITOT in the last 168 hours. Hematology Recent Labs  Lab 03/13/18 0231  WBC 5.4  RBC 3.74*  HGB 10.5*  HCT 34.9*  MCV 93.3  MCH 28.1  MCHC 30.1  RDW 22.7*  PLT 295   Cardiac Enzymes Recent Labs  Lab 03/13/18 0231  TROPONINI 0.06*   No results for input(s): TROPIPOC in the last 168 hours.  BNPNo results for input(s): BNP, PROBNP in the last 168 hours.  DDimer No results for input(s): DDIMER in the last 168 hours.  Radiology/Studies:  Ct Head Wo Contrast  Result Date: 03/13/2018 CLINICAL DATA:  Found down. RIGHT leg bruising. History of atrial fibrillation. EXAM: CT HEAD WITHOUT CONTRAST CT CERVICAL SPINE WITHOUT CONTRAST TECHNIQUE: Multidetector CT imaging of the head and cervical spine was performed following the standard protocol without intravenous contrast. Multiplanar CT image reconstructions of the cervical spine were also generated. COMPARISON:  None. FINDINGS: CT HEAD FINDINGS-mild motion degraded examination. BRAIN: No intraparenchymal hemorrhage, mass effect nor midline shift. The ventricles and sulci are normal for age. Patchy supratentorial white matter hypodensities. No acute large vascular territory infarcts. No abnormal extra-axial fluid collections. Basal cisterns are patent. VASCULAR: Mild-to-moderate calcific atherosclerosis of the carotid siphons.  SKULL: No skull fracture. No significant scalp soft tissue swelling. SINUSES/ORBITS: Trace paranasal sinus mucosal thickening. Minimal LEFT mastoid effusion. Included ocular globes and orbital contents are non-suspicious. Status post bilateral ocular lens implants. Punctate LEFT ocular globe anterior chain burr foreign body most compatible with glaucoma drainage device. OTHER: None. CT CERVICAL SPINE FINDINGS ALIGNMENT: Straightened cervical lordosis. Grade 1 C3-4 through C5-6 and C7-T1 anterolisthesis. SKULL BASE AND VERTEBRAE: Vertebral bodies intact. Multilevel severe degenerative discs and severe facet arthropathy. C1-2 articulation maintained with severe arthropathy. RIGHT atlantooccipital arthrodesis. Old LEFT second rib fracture with callus. No destructive bony lesions. SOFT TISSUES AND SPINAL CANAL: Nonacute. Heterogeneous thyroid. Moderate calcific atherosclerosis carotid bifurcations. DISC LEVELS: Severe canal stenosis C3-4 and C5-6. Severe neural foraminal narrowing all cervical levels. UPPER CHEST: Lung apices are clear. Pacemaker leads via LEFT subclavian venous approach. OTHER: None. IMPRESSION: CT HEAD: 1. No acute intracranial process. 2. Moderate parenchymal brain volume loss and moderate chronic small vessel ischemic changes. CT cervical spine: 1. No fracture.  Multilevel spondylolisthesis on degenerative basis. 2. Severe  canal stenosis C3-4 and C5-6. Severe neural foraminal narrowing all levels. Electronically Signed   By: Awilda Metro M.D.   On: 03/13/2018 03:37   Ct Cervical Spine Wo Contrast  Result Date: 03/13/2018 CLINICAL DATA:  Found down. RIGHT leg bruising. History of atrial fibrillation. EXAM: CT HEAD WITHOUT CONTRAST CT CERVICAL SPINE WITHOUT CONTRAST TECHNIQUE: Multidetector CT imaging of the head and cervical spine was performed following the standard protocol without intravenous contrast. Multiplanar CT image reconstructions of the cervical spine were also generated.  COMPARISON:  None. FINDINGS: CT HEAD FINDINGS-mild motion degraded examination. BRAIN: No intraparenchymal hemorrhage, mass effect nor midline shift. The ventricles and sulci are normal for age. Patchy supratentorial white matter hypodensities. No acute large vascular territory infarcts. No abnormal extra-axial fluid collections. Basal cisterns are patent. VASCULAR: Mild-to-moderate calcific atherosclerosis of the carotid siphons. SKULL: No skull fracture. No significant scalp soft tissue swelling. SINUSES/ORBITS: Trace paranasal sinus mucosal thickening. Minimal LEFT mastoid effusion. Included ocular globes and orbital contents are non-suspicious. Status post bilateral ocular lens implants. Punctate LEFT ocular globe anterior chain burr foreign body most compatible with glaucoma drainage device. OTHER: None. CT CERVICAL SPINE FINDINGS ALIGNMENT: Straightened cervical lordosis. Grade 1 C3-4 through C5-6 and C7-T1 anterolisthesis. SKULL BASE AND VERTEBRAE: Vertebral bodies intact. Multilevel severe degenerative discs and severe facet arthropathy. C1-2 articulation maintained with severe arthropathy. RIGHT atlantooccipital arthrodesis. Old LEFT second rib fracture with callus. No destructive bony lesions. SOFT TISSUES AND SPINAL CANAL: Nonacute. Heterogeneous thyroid. Moderate calcific atherosclerosis carotid bifurcations. DISC LEVELS: Severe canal stenosis C3-4 and C5-6. Severe neural foraminal narrowing all cervical levels. UPPER CHEST: Lung apices are clear. Pacemaker leads via LEFT subclavian venous approach. OTHER: None. IMPRESSION: CT HEAD: 1. No acute intracranial process. 2. Moderate parenchymal brain volume loss and moderate chronic small vessel ischemic changes. CT cervical spine: 1. No fracture.  Multilevel spondylolisthesis on degenerative basis. 2. Severe canal stenosis C3-4 and C5-6. Severe neural foraminal narrowing all levels. Electronically Signed   By: Awilda Metro M.D.   On: 03/13/2018 03:37    Ct Pelvis Wo Contrast  Result Date: 03/13/2018 CLINICAL DATA:  Post fall with left hip pain. EXAM: CT PELVIS WITHOUT CONTRAST TECHNIQUE: Multidetector CT imaging of the pelvis was performed following the standard protocol without intravenous contrast. COMPARISON:  Radiographs earlier this day. FINDINGS: Urinary Tract: Distal ureters are decompressed. Urinary bladder is distended without wall thickening. Bowel: Minimal colonic diverticulosis. Pelvic bowel loops are unremarkable. Normal appendix. Vascular/Lymphatic: Mild aorta bi-iliac atherosclerosis. No adenopathy. Reproductive: Uterine calcifications likely fibroids and vascular. 2.5 cm cystic structure in the left adnexa. Other:  No pelvic free fluid. Musculoskeletal: No pelvic fracture. Particularly, no fracture of the left inferior pubic ramus as questioned on radiograph. Moderate to advanced bilateral hip osteoarthritis. Pubic symphysis is congruent. Mild degenerative change of the sacroiliac joints. Prominent degenerative change in the lower lumbar spine. IMPRESSION: 1. No pelvic fracture. Particularly, no left inferior pubic ramus fracture is questioned on radiograph. 2. Moderate to advanced bilateral hip osteoarthritis. 3. A 2.5 cm cystic structure in the left adnexa is most likely benign, given size less than 3 cm and late postmenopausal status, no dedicated imaging follow-up is needed. Electronically Signed   By: Narda Rutherford M.D.   On: 03/13/2018 03:53   Dg Chest Port 1 View  Result Date: 03/13/2018 CLINICAL DATA:  Fall. EXAM: PORTABLE CHEST 1 VIEW COMPARISON:  None. FINDINGS: Left-sided pacemaker in place. The heart is enlarged. There is atherosclerosis of the thoracic aorta. No pneumothorax, focal  airspace disease, pulmonary edema or pleural effusion. No acute osseous abnormalities are seen. IMPRESSION: Cardiomegaly.  No evidence of acute traumatic injury. Electronically Signed   By: Narda Rutherford M.D.   On: 03/13/2018 03:22   Dg  Hip Unilat W Or Wo Pelvis 2-3 Views Left  Result Date: 03/13/2018 CLINICAL DATA:  Post fall with left hip pain. EXAM: DG HIP (WITH OR WITHOUT PELVIS) 2-3V LEFT COMPARISON:  None. FINDINGS: Possible but not definite nondisplaced left inferior ramus fracture. No other fracture of the pelvis. Pubic symphysis and sacroiliac joints are congruent. Moderate to advanced bilateral hip osteoarthritis. Both femoral heads are well-seated in the respective acetabula. IMPRESSION: 1. Possible but not definite nondisplaced left inferior ramus fracture. 2. Moderate to advanced bilateral hip osteoarthritis. Electronically Signed   By: Narda Rutherford M.D.   On: 03/13/2018 03:20    Assessment and Plan:   Permanent Atrial fibrillation with RVR in setting of recent fall --Patient presented after a fall from rehab and presumed likely d/t rolling over in bed (per daughter in law). Found on floor. AMS at presentation. Oriented to self and time but not location. H/o SSS with [employer] PPM. Previous interrogation above, pending updated interrogation. Current anemia and on Xarelto / anticoagulation. Documented recent fall history including hitting her head with patient noted to have progressive decline --Hypotensive with BB dosage reduced and CCB stopped given hypotension and bradycardia. Continue to monitor on telemetry and assess vitals. RN note placed for update on vitals --CHA2DS2VASc score of at least 7 (HFpEF, HTN, agex2, DM2, vascular, female); however, recent fall with anemia should be considered in the setting of ongoing chronic anticoagulation. Recommend consider stopping Xarelto. --Elevated troponin at admission and likely secondary to demand ischemia with rapid ventricular rate. Continue to monitor on telemetry.  --Plan for rate control only at this time.  PTA medications included Xarelto 15mg  - recommend stopping anticoagulation given anemia and history of falls. Holding CCB and decreased BB given hypotension with  hold parameter. Lasix stopped in ED. Potassium supplement stopped d/t hyperkalemia today 12/17 and as no longer on diuresis. Echo as above with preserved EF.  Chronic diastolic heart failure  --Echo as above, preserved EF --Continue to hold lasix d/t hypotension, AKI as below  AKI  -- Daily BMET -- Bump since 12/9 from 1.38  1.70  --  Continue to hold diuretic. Do not recommend ACE/ARB/spiro d/t renal insufficiency and hyperkalemia. Holding CCB and decreased BB d/t hypotension to prevent further prerenal injury as hypotensive with SBP in 80s. Caution with contrast if imaging.  Hyperkalemia -- K 4.3   5.1. Due to rapid rate of increase from 3.6  5.1 since admission, ordered repeat potassium with results of 4.4. Stopped potassium supplementation given diuretic stopped in ED.   --Daily BMET  Anemia - Hgb 10.1  10.5  8.4 - Daily CBC. Recommend stop anticoagulation given high fall risk - Recommend transfuse with hemoglobin below 8 and consider further evaluation for bleed if continued decline  AMS  --Not documented in previous notes / office visits. Per daughter in law, progressive over the last few months.  --Palliative care consulted --Per IM, palliative care  For questions or updates, please contact [facility] Please consult [email or web address] for contact info under     Signed, Lennon Alstrom, PA-C  03/14/2018 7:46 AM

## 2018-03-14 NOTE — Progress Notes (Addendum)
[employer] - [place] at [facility]   PATIENT NAME: Cindy Hogan    MR#:  [id number]  DATE OF BIRTH:  04/02/1933  SUBJECTIVE:  CHIEF COMPLAINT:   Chief Complaint  Patient presents with  . Fall   The patient is confused. REVIEW OF SYSTEMS:  Review of Systems  Unable to perform ROS: Mental status change    DRUG ALLERGIES:   Allergies  Allergen Reactions  . Eliquis [Apixaban]   . Lisinopril   . Neosporin [Neomycin-Bacitracin Zn-Polymyx]   . Penicillins   . Sulfa Antibiotics    VITALS:  Blood pressure (!) 100/49, pulse 76, temperature (!) 97.3 F (36.3 C), temperature source Oral, resp. rate 18, height 5\' 5"  (1.651 m), weight 62.2 kg, SpO2 90 %. PHYSICAL EXAMINATION:  Physical Exam HENT:     Head: Normocephalic.     Mouth/Throat:     Mouth: Mucous membranes are dry.  Eyes:     General: No scleral icterus.    Conjunctiva/sclera: Conjunctivae normal.     Pupils: Pupils are equal, round, and reactive to light.  Neck:     Musculoskeletal: Normal range of motion and neck supple.     Vascular: No JVD.     Trachea: No tracheal deviation.  Cardiovascular:     Rate and Rhythm: Normal rate and regular rhythm.     Heart sounds: Normal heart sounds. No murmur. No gallop.   Pulmonary:     Effort: Pulmonary effort is normal. No respiratory distress.     Breath sounds: Normal breath sounds. No wheezing or rales.  Abdominal:     General: Bowel sounds are normal. There is no distension.     Palpations: Abdomen is soft.     Tenderness: There is no abdominal tenderness. There is no rebound.  Musculoskeletal: Normal range of motion.        General: No tenderness.  Skin:    Findings: No erythema or rash.  Neurological:     General: No focal deficit present.     Mental Status: She is alert.     Cranial Nerves: No cranial nerve deficit.     Comments: She is confused but follow commands.    LABORATORY PANEL:  Female CBC Recent Labs  Lab 03/14/18 1435    WBC 4.5  HGB 8.4*  HCT 28.6*  PLT 295   ------------------------------------------------------------------------------------------------------------------ Chemistries  Recent Labs  Lab 03/14/18 0549 03/14/18 1117  NA 134*  --   K 5.1 4.4  CL 99  --   CO2 22  --   GLUCOSE 113*  --   BUN 35*  --   CREATININE 1.70*  --   CALCIUM 8.4*  --   MG 1.9  --    RADIOLOGY:  No results found. ASSESSMENT AND PLAN:   This is an 84 year old female admitted for atrial fibrillation with rapid ventricular rate.  1. A. fib: With RVR; Continue Xarelto, continue Lopressor, hold Cardizem p.o per cardiology consult.  Discontinued diltiazem drip. Decreased Lopressor to 50 mg twice daily due to low side blood pressure.  2. Elevated troponin: Secondary to demand ischemia.  3. AKI due to dehydration, IV fluid support and follow-up BMP.    Acute metabolic encephalopathy due to dehydration and hypotension. Aspiration the fall precaution.  4.  Generalized weakness andfalls PT/OT  5. CHF: Stable.  Hold Lasix, echo: EF: 55% -   65%. Moderate to advanced bilateral hip osteoarthritis.  All the records are reviewed and case discussed with Care Management/Social  Worker. Management plans discussed with the patient, her daughter and they are in agreement.  CODE STATUS: DNR  TOTAL TIME TAKING CARE OF THIS PATIENT: 26 minutes.   More than 50% of the time was spent in counseling/coordination of care: YES  POSSIBLE D/C IN 2 DAYS, DEPENDING ON CLINICAL CONDITION.   Shaune Pollack M.D on 03/14/2018 at 4:20 PM  Between 7am to 6pm - Pager - [phone]  After 6pm go to [email or web address] - Therapist, nutritional Hospitalists

## 2018-03-15 DIAGNOSIS — N289 Disorder of kidney and ureter, unspecified: Secondary | ICD-10-CM

## 2018-03-15 DIAGNOSIS — R634 Abnormal weight loss: Secondary | ICD-10-CM

## 2018-03-15 DIAGNOSIS — I4891 Unspecified atrial fibrillation: Secondary | ICD-10-CM

## 2018-03-15 DIAGNOSIS — Z515 Encounter for palliative care: Secondary | ICD-10-CM

## 2018-03-15 DIAGNOSIS — N189 Chronic kidney disease, unspecified: Secondary | ICD-10-CM

## 2018-03-15 DIAGNOSIS — R7989 Other specified abnormal findings of blood chemistry: Secondary | ICD-10-CM

## 2018-03-15 LAB — BASIC METABOLIC PANEL
Anion gap: 8 (ref 5–15)
BUN: 27 mg/dL — ABNORMAL HIGH (ref 8–23)
CO2: 25 mmol/L (ref 22–32)
Calcium: 8.2 mg/dL — ABNORMAL LOW (ref 8.9–10.3)
Chloride: 103 mmol/L (ref 98–111)
Creatinine, Ser: 1.32 mg/dL — ABNORMAL HIGH (ref 0.44–1.00)
GFR calc Af Amer: 43 mL/min — ABNORMAL LOW (ref >60)
GFR calc non Af Amer: 37 mL/min — ABNORMAL LOW (ref >60)
Glucose, Bld: 99 mg/dL (ref 70–99)
Potassium: 3.9 mmol/L (ref 3.5–5.1)
Sodium: 136 mmol/L (ref 135–145)

## 2018-03-15 LAB — CBC WITH DIFFERENTIAL/PLATELET
Abs Immature Granulocytes: 0.12 10*3/uL — ABNORMAL HIGH (ref 0.00–0.07)
Basophils Absolute: 0 10*3/uL (ref 0.0–0.1)
Basophils Relative: 1 %
Eosinophils Absolute: 0 10*3/uL (ref 0.0–0.5)
Eosinophils Relative: 1 %
HCT: 30 % — ABNORMAL LOW (ref 36.0–46.0)
Hemoglobin: 8.9 g/dL — ABNORMAL LOW (ref 12.0–15.0)
Immature Granulocytes: 3 %
Lymphocytes Relative: 22 %
Lymphs Abs: 0.9 10*3/uL (ref 0.7–4.0)
MCH: 28.2 pg (ref 26.0–34.0)
MCHC: 29.7 g/dL — ABNORMAL LOW (ref 30.0–36.0)
MCV: 94.9 fL (ref 80.0–100.0)
Monocytes Absolute: 1 10*3/uL (ref 0.1–1.0)
Monocytes Relative: 22 %
Neutro Abs: 2.2 10*3/uL (ref 1.7–7.7)
Neutrophils Relative %: 51 %
Platelets: 306 10*3/uL (ref 150–400)
RBC: 3.16 MIL/uL — ABNORMAL LOW (ref 3.87–5.11)
RDW: 22.8 % — ABNORMAL HIGH (ref 11.5–15.5)
WBC: 4.3 10*3/uL (ref 4.0–10.5)

## 2018-03-15 MED ORDER — ENOXAPARIN SODIUM 40 MG/0.4ML ~~LOC~~ SOLN
40.0000 mg | SUBCUTANEOUS | Status: DC
  Administered 2018-03-15: 40 mg via SUBCUTANEOUS
  Filled 2018-03-15: qty 0.4

## 2018-03-15 MED ORDER — METOPROLOL TARTRATE 25 MG PO TABS
12.5000 mg | ORAL_TABLET | Freq: Two times a day (BID) | ORAL | Status: DC
  Administered 2018-03-15 – 2018-03-16 (×3): 12.5 mg via ORAL
  Filled 2018-03-15 (×4): qty 1

## 2018-03-15 NOTE — Clinical Social Work Note (Signed)
CSW spoke to patient's daughter who met with palliative today, and daughter has decided she would like patient to return back to ALF with hospice services.  CSW contacted Lattie Haw at ALF, and she is in agreement with having patient return back to ALF with hospice services.  Case manager spoke to patient's daughter and she chose [facility].  CSW to continue to follow patient's progress throughout discharge planning.  Jones Broom. [place], MSW, [place]  03/15/2018 5:15 PM

## 2018-03-15 NOTE — Progress Notes (Addendum)
Progress Note  Patient Name: Cindy Hogan Date of Encounter: 03/15/2018  Primary Cardiologist: [facility] - Dr. Okey Dupre  Subjective   No complaints  Nurses reports she is not eating very much, spit out her medications and food on the nurse Blood pressure low, most of her medications have been held Ecchymoses on her face, lip Notes detailing fall out of bed, history of recurrent falls   Inpatient Medications    Scheduled Meds: . docusate sodium  100 mg Oral BID  . DULoxetine  30 mg Oral Daily  . enoxaparin (LOVENOX) injection  40 mg Subcutaneous Q24H  . escitalopram  5 mg Oral Daily  . mouth rinse  15 mL Mouth Rinse BID  . [START ON 03/16/2018] methotrexate  12.5 mg Oral Q Thu  . metoprolol tartrate  12.5 mg Oral BID  . nystatin  5 mL Oral QID  . pantoprazole  40 mg Oral Daily  . polyethylene glycol  17 g Oral Daily  . predniSONE  7.5 mg Oral Daily  . senna-docusate  2 tablet Oral QHS  . vitamin B-12  1,000 mcg Oral Daily   Continuous Infusions:  PRN Meds: acetaminophen **OR** acetaminophen, bisacodyl, ondansetron **OR** ondansetron (ZOFRAN) IV   Vital Signs    Vitals:   03/14/18 1624 03/14/18 1932 03/15/18 0421 03/15/18 1748  BP: (!) 104/55 108/66 100/74 (!) 98/48  Pulse: 99 63 87 100  Resp: 12   16  Temp:  98 F (36.7 C) 97.9 F (36.6 C)   TempSrc:  Oral Oral   SpO2: 100% 93% 98% 96%  Weight:   72.3 kg   Height:        Intake/Output Summary (Last 24 hours) at 03/15/2018 1835 Last data filed at 03/15/2018 1527 Gross per 24 hour  Intake 240 ml  Output 150 ml  Net 90 ml   Filed Weights   03/13/18 0654 03/14/18 0332 03/15/18 0421  Weight: 60.2 kg 72 kg 72.3 kg    Telemetry    Atrial fibrillation, rate 90-100- Personally Reviewed  ECG    - Personally Reviewed  Physical Exam   GEN: No acute distress.   Ecchymosis on lip, missing several teeth, no distress Neck: No JVD Cardiac:  Irregularly irregular, no murmurs, rubs, or gallops.  Respiratory:  Clear to auscultation bilaterally. GI: Soft, nontender, non-distended  MS: No edema; No deformity. Neuro:  Nonfocal  Psych: Normal affect   Labs    Chemistry Recent Labs  Lab 03/13/18 0231 03/14/18 0549 03/14/18 1117 03/15/18 0317  NA 130* 134*  --  136  K 4.3 5.1 4.4 3.9  CL 92* 99  --  103  CO2 24 22  --  25  GLUCOSE 144* 113*  --  99  BUN 36* 35*  --  27*  CREATININE 1.86* 1.70*  --  1.32*  CALCIUM 8.9 8.4*  --  8.2*  GFRNONAA 25* 28*  --  37*  GFRAA 29* 32*  --  43*  ANIONGAP 14 13  --  8     Hematology Recent Labs  Lab 03/13/18 0231 03/14/18 1435 03/15/18 0317  WBC 5.4 4.5 4.3  RBC 3.74* 2.97* 3.16*  HGB 10.5* 8.4* 8.9*  HCT 34.9* 28.6* 30.0*  MCV 93.3 96.3 94.9  MCH 28.1 28.3 28.2  MCHC 30.1 29.4* 29.7*  RDW 22.7* 22.6* 22.8*  PLT 295 295 306    Cardiac Enzymes Recent Labs  Lab 03/13/18 0231  TROPONINI 0.06*   No results for input(s): TROPIPOC in the  last 168 hours.   BNPNo results for input(s): BNP, PROBNP in the last 168 hours.   DDimer No results for input(s): DDIMER in the last 168 hours.   Radiology    No results found.  Cardiac Studies   Echocardiogram  - Left ventricle: The cavity size was normal. Systolic function was   normal. The estimated ejection fraction was in the range of 55%   to 65%. - Mitral valve: There was mild regurgitation. - Pulmonary arteries: PA peak pressure: 59 mm Hg (S).  Patient Profile     Cindy Hogan is a 84 y.o. female with a hx of hypertensive heart disease / ICM, chronic diastolic heart failure (Ef 55-65%), h/o SSS with subsequent [employer] dual chamber pacemaker implantation, permanent atrial fibrillation on anticoagulation, anemia, DM2 not on medication (11/7 A1C 7.0), venous stasis dermatitis, rheumatoid arthritis, osteoporosis, glaucoma, bilateral hearing loss with hearing aids, anxiety, depression, and GERD who is being seen today for the evaluation of Atrial Fibrillation  Assessment & Plan      Atrial fibrillation Given frequent falls, anticoagulation likely contraindicated Decision whether to restart anticoagulation could be made at a later date by outpatient cardiology at [facility] -Rate control challenging given hypotension Beta-blocker dose has been significantly decreased down to 12.5 twice daily with hold parameters  Fall Notes indicating history of falls Would consider PT evaluation, Discussion with family concerning home situation versus placement  Anemia Hemoglobin 8 to 9 Anti-coagulation on hold  Poor nutrition  Acute renal failure Diuretic on hold Does not appear dehydrated, moderately elevated right heart pressures Creatinine 1.7 down to 1.32 today  No family at the bedside for discussion Case discussed with nursing  total encounter time more than 25 minutes  Greater than 50% was spent in counseling and coordination of care with the patient   For questions or updates, please contact [facility] Please consult [email or web address] for contact info under        Signed, Julien Nordmann, MD  03/15/2018, 6:35 PM

## 2018-03-15 NOTE — Care Management Note (Signed)
Case Management Note  Patient Details  Name: Cindy Hogan MRN: [id number] Date of Birth: [birth year]  Subjective/Objective:   Patient will likely discharge tomorrow to [facility] with [facility].  Referral made to [facility] after Patient's daughter requested this agency.  Misty Stanley with [facility] is aware that patient will need a hospital bed and oxygen.  [phone].  RNCM faxed H&P, MAR, demographic and will fax discharge summary once complete.               Action/Plan:   Expected Discharge Date:                  Expected Discharge Plan:  Home w Hospice Care  In-House Referral:     Discharge planning Services  CM Consult  Post Acute Care Choice:  Hospice Choice offered to:  Adult Children  DME Arranged:    DME Agency:     HH Arranged:    HH Agency:  [facility])  Status of Service:  Completed, signed off  If discussed at [employer] of Stay Meetings, dates discussed:    Additional Comments:  Sherren Kerns, RN 03/15/2018, 3:43 PM

## 2018-03-15 NOTE — Progress Notes (Signed)
PT Cancellation Note  Patient Details Name: Cindy Hogan MRN: [id number] DOB: 1933-05-15   Cancelled Treatment:    Reason Eval/Treat Not Completed: Patient declined multiple attempts at PT eval this session.  Nursing present during PM attempt and assisted in attempting to get pt to participate with PT evaluation with pt continuing to adamantly refuse stating that she had neck pain.  Nursing assisted pt with donning neck brace.  Will attempt to see pt at a future date/time as appropriate.     Ovidio Hanger PT, DPT 03/15/18, 2:59 PM

## 2018-03-15 NOTE — Plan of Care (Signed)
  Problem: Education: Goal: Knowledge of General Education information will improve Description Including pain rating scale, medication(s)/side effects and non-pharmacologic comfort measures Outcome: Not Met (add Reason)  Intermittent confusion

## 2018-03-15 NOTE — Progress Notes (Signed)
Crushed patients medications in applesauce and tried to feed patient and she started spitting at nurse.  She refused to take any of the rest of them and told me to "get out".  Unsure of how much patient was able to take/swallow of her medications since she spit some of it out.

## 2018-03-15 NOTE — Progress Notes (Signed)
[employer] -  at [facility] [place]   PATIENT NAME: Cindy Hogan    MR#:  [id number]  DATE OF BIRTH:  May 14, 1933  SUBJECTIVE:  CHIEF COMPLAINT:   Chief Complaint  Patient presents with  . Fall   The patient is confused and not eating well. REVIEW OF SYSTEMS:  Review of Systems  Unable to perform ROS: Mental status change    DRUG ALLERGIES:   Allergies  Allergen Reactions  . Eliquis [Apixaban]   . Lisinopril   . Neosporin [Neomycin-Bacitracin Zn-Polymyx]   . Penicillins   . Sulfa Antibiotics    VITALS:  Blood pressure (!) 98/48, pulse 100, temperature 97.9 F (36.6 C), temperature source Oral, resp. rate 16, height 5\' 5"  (1.651 m), weight 72.3 kg, SpO2 96 %. PHYSICAL EXAMINATION:  Physical Exam HENT:     Head: Normocephalic.     Mouth/Throat:     Mouth: Mucous membranes are dry.  Eyes:     General: No scleral icterus.    Conjunctiva/sclera: Conjunctivae normal.     Pupils: Pupils are equal, round, and reactive to light.  Neck:     Musculoskeletal: Normal range of motion and neck supple.     Vascular: No JVD.     Trachea: No tracheal deviation.  Cardiovascular:     Rate and Rhythm: Normal rate and regular rhythm.     Heart sounds: Normal heart sounds. No murmur. No gallop.   Pulmonary:     Effort: Pulmonary effort is normal. No respiratory distress.     Breath sounds: Normal breath sounds. No wheezing or rales.  Abdominal:     General: Bowel sounds are normal. There is no distension.     Palpations: Abdomen is soft.     Tenderness: There is no abdominal tenderness. There is no rebound.  Musculoskeletal: Normal range of motion.        General: No tenderness.  Skin:    Findings: No erythema or rash.  Neurological:     General: No focal deficit present.     Mental Status: She is alert.     Cranial Nerves: No cranial nerve deficit.     Comments: She is confused but follow commands.    LABORATORY PANEL:  Female CBC Recent Labs  Lab  03/15/18 0317  WBC 4.3  HGB 8.9*  HCT 30.0*  PLT 306   ------------------------------------------------------------------------------------------------------------------ Chemistries  Recent Labs  Lab 03/14/18 0549  03/15/18 0317  NA 134*  --  136  K 5.1   < > 3.9  CL 99  --  103  CO2 22  --  25  GLUCOSE 113*  --  99  BUN 35*  --  27*  CREATININE 1.70*  --  1.32*  CALCIUM 8.4*  --  8.2*  MG 1.9  --   --    < > = values in this interval not displayed.   RADIOLOGY:  No results found. ASSESSMENT AND PLAN:   This is an 84 year old female admitted for atrial fibrillation with rapid ventricular rate.  1. A. fib: With RVR; Per Dr. 97, discontinue Xarelto, decrease the dose of Lopressor, hold Cardizem p.o per cardiology consult.  Discontinued diltiazem drip. Decreased Lopressor to 12.5 mg twice daily due to low side blood pressure.  2. Elevated troponin: Secondary to demand ischemia.  3. AKI due to dehydration, improving IV fluid support.  Acute metabolic encephalopathy due to dehydration and hypotension. Aspiration the fall precaution.  4.  Generalized weakness andfalls PT/OT  5.  CHF: Stable.  Hold Lasix, echo: EF: 55% -   65%. Moderate to advanced bilateral hip osteoarthritis.  Per palliative care consult, the patient daughter agrees to hospice care at assisted living facility.  I discussed with Dr. Mariah Milling and palliative care nurse practitioner. All the records are reviewed and case discussed with Care Management/Social Worker. Management plans discussed with the patient, her daughter and they are in agreement.  CODE STATUS: DNR  TOTAL TIME TAKING CARE OF THIS PATIENT: 28 minutes.   More than 50% of the time was spent in counseling/coordination of care: YES  POSSIBLE D/C IN 1-2 DAYS, DEPENDING ON CLINICAL CONDITION.   Shaune Pollack M.D on 03/15/2018 at 6:08 PM  Between 7am to 6pm - Pager - [phone]  After 6pm go to [email or web address] - Programmer, systems Hospitalists

## 2018-03-16 ENCOUNTER — Encounter: Payer: Self-pay | Admitting: *Deleted

## 2018-03-16 DIAGNOSIS — W19XXXD Unspecified fall, subsequent encounter: Secondary | ICD-10-CM

## 2018-03-16 DIAGNOSIS — I4891 Unspecified atrial fibrillation: Secondary | ICD-10-CM

## 2018-03-16 LAB — MRSA PCR SCREENING: MRSA by PCR: NEGATIVE

## 2018-03-16 MED ORDER — METOPROLOL TARTRATE 25 MG PO TABS: 12.5000 mg | ORAL_TABLET | Freq: Two times a day (BID) | ORAL | 0 refills | Status: AC

## 2018-03-16 NOTE — Care Management Important Message (Signed)
Spoke with Jamesetta So, daughter to review Medicare IM. Aware of right and in agreement with discharge plan. Verbal consent given to sign form for chart.

## 2018-03-16 NOTE — Clinical Social Work Note (Signed)
CSW spoke with Misty Stanley at [facility], [phone] they can accept patient back today with Hospice to follow.    CSW contacted [facility] and spoke with Leota Sauers, [phone], she said patient's bed is on the way. Misty Stanley asked for discharge summary to be faxed, CSW faxed discharge summary to [facility].  Misty Stanley RN at [facility] also asked for discharge summary and FL2 to be faxed to her.  CSW faxed requested information to both ALF and [employer].  CSW contacted patient's daughter Jamesetta So [phone] and informed her that patient will be discharging today via EMS.  Patient's daughter requested a phone call from bedside nurse once EMS arrives.  Ervin Knack. Jolin Benavides, MSW, Theresia Majors [phone]  03/16/2018 3:25 PM

## 2018-03-16 NOTE — [place] FL2 (Signed)
[place] MEDICAID FL2 LEVEL OF CARE SCREENING TOOL     IDENTIFICATION  Patient Name: Cindy Hogan Birthdate: 04/21/1933 Sex: female Admission Date (Current Location): 03/13/2018  [place] and [place] Number:  Chiropodist and Address:  [facility], [address], [place], [place] [address]      Provider Number: [id number]  Attending Physician Name and Address:  Adrian Saran, MD  Relative Name and Phone Number:  Read Drivers   [phone]     Current Level of Care: Hospital Recommended Level of Care: Assisted Living Facility Prior Approval Number:    Date Approved/Denied:   PASRR Number:    Discharge Plan: Domiciliary (Rest home)    Current Diagnoses: Patient Active Problem List   Diagnosis Date Noted  . Acute on chronic renal insufficiency   . Atrial fibrillation with rapid ventricular response (HCC)   . Loss of weight   . Comfort measures only status   . Palliative care encounter   . Permanent atrial fibrillation   . Atrial fibrillation with RVR (HCC) 03/13/2018  . Pressure injury of skin 03/13/2018    Orientation RESPIRATION BLADDER Height & Weight     Self, Place  O2 Normal room air Continent Weight: 160 lb 11.2 oz (72.9 kg) Height:  5\' 5"  (165.1 cm)  BEHAVIORAL SYMPTOMS/MOOD NEUROLOGICAL BOWEL NUTRITION STATUS      Continent Diet(Cardiac diet)  AMBULATORY STATUS COMMUNICATION OF NEEDS Skin   Supervision Verbally PU Stage and Appropriate Care   PU Stage 2 Dressing: Daily                   Personal Care Assistance Level of Assistance  Bathing, Feeding, Dressing Bathing Assistance: Limited assistance Feeding assistance: Independent Dressing Assistance: Limited assistance     Functional Limitations Info  Sight, Hearing, Speech Sight Info: Adequate Hearing Info: Impaired Speech Info: Adequate    SPECIAL CARE FACTORS FREQUENCY  Home with hospice                   Contractures Contractures  Info: Not present    Additional Factors Info  Code Status, Allergies, Psychotropic Code Status Info: DNR Allergies Info: ELIQUIS APIXABAN, LISINOPRIL, NEOSPORIN NEOMYCIN-BACITRACIN ZN-POLYMYX, PENICILLINS, SULFA ANTIBIOTICS  Psychotropic Info: escitalopram (LEXAPRO) tablet 5 mg          Current Medications (03/16/2018):  This is the current hospital active medication list Current Facility-Administered Medications  Medication Dose Route Frequency Provider Last Rate Last Dose  . acetaminophen (TYLENOL) tablet 650 mg  650 mg Oral Q6H PRN 03/18/2018, MD   650 mg at 03/16/18 1031   Or  . acetaminophen (TYLENOL) suppository 650 mg  650 mg Rectal Q6H PRN 03/18/18, MD      . bisacodyl (DULCOLAX) EC tablet 5 mg  5 mg Oral Daily PRN Arnaldo Natal, MD      . docusate sodium (COLACE) capsule 100 mg  100 mg Oral BID Shaune Pollack, MD   100 mg at 03/15/18 2153  . DULoxetine (CYMBALTA) DR capsule 30 mg  30 mg Oral Daily 2154, MD   30 mg at 03/15/18 1008  . enoxaparin (LOVENOX) injection 40 mg  40 mg Subcutaneous Q24H 03/17/18, MD   40 mg at 03/15/18 2153  . escitalopram (LEXAPRO) tablet 5 mg  5 mg Oral Daily 2154, MD   5 mg at 03/15/18 1007  . MEDLINE mouth rinse  15 mL Mouth Rinse BID 03/17/18,  MD   15 mL at 03/16/18 1033  . methotrexate (RHEUMATREX) tablet 12.5 mg  12.5 mg Oral Q Leda Min, MD      . metoprolol tartrate (LOPRESSOR) tablet 12.5 mg  12.5 mg Oral BID Antonieta Iba, MD   12.5 mg at 03/16/18 1025  . nystatin (MYCOSTATIN) 100000 UNIT/ML suspension 500,000 Units  5 mL Oral QID Arnaldo Natal, MD   500,000 Units at 03/15/18 2202  . ondansetron (ZOFRAN) tablet 4 mg  4 mg Oral Q6H PRN Arnaldo Natal, MD       Or  . ondansetron [facility]) injection 4 mg  4 mg Intravenous Q6H PRN Arnaldo Natal, MD      . pantoprazole (PROTONIX) EC tablet 40 mg  40 mg Oral Daily Arnaldo Natal, MD   40 mg at 03/15/18 1008  .  polyethylene glycol (MIRALAX / GLYCOLAX) packet 17 g  17 g Oral Daily Arnaldo Natal, MD   17 g at 03/14/18 0935  . predniSONE (DELTASONE) tablet 7.5 mg  7.5 mg Oral Daily Arnaldo Natal, MD   7.5 mg at 03/15/18 1008  . senna-docusate (Senokot-S) tablet 2 tablet  2 tablet Oral QHS Arnaldo Natal, MD   2 tablet at 03/15/18 2154  . vitamin B-12 (CYANOCOBALAMIN) tablet 1,000 mcg  1,000 mcg Oral Daily Arnaldo Natal, MD   1,000 mcg at 03/15/18 1008     Discharge Medications: STOP taking these medications   albuterol 108 (90 Base) MCG/ACT inhaler Commonly known as:  PROVENTIL HFA;VENTOLIN HFA   alendronate 70 MG tablet Commonly known as:  FOSAMAX   diltiazem 120 MG 24 hr capsule Commonly known as:  CARDIZEM CD   DULoxetine 30 MG capsule Commonly known as:  CYMBALTA   ferrous sulfate 325 (65 FE) MG tablet   fluconazole 100 MG tablet Commonly known as:  DIFLUCAN   folic acid 1 MG tablet Commonly known as:  FOLVITE   furosemide 40 MG tablet Commonly known as:  LASIX   multivitamin with minerals Tabs tablet   potassium chloride 10 MEQ tablet Commonly known as:  K-DUR,KLOR-CON   Rivaroxaban 15 MG Tabs tablet Commonly known as:  XARELTO   senna-docusate 8.6-50 MG tablet Commonly known as:  Senokot-S     TAKE these medications   escitalopram 5 MG tablet Commonly known as:  LEXAPRO Take 5 mg by mouth daily.   methotrexate 2.5 MG tablet Commonly known as:  RHEUMATREX Take 12.5 mg by mouth every Thursday. Caution:Chemotherapy. Protect from light.   metoprolol tartrate 25 MG tablet Commonly known as:  LOPRESSOR Take 0.5 tablets (12.5 mg total) by mouth 2 (two) times daily. What changed:    medication strength  how much to take   nystatin 100000 UNIT/ML suspension Commonly known as:  MYCOSTATIN Take 5 mLs by mouth 4 (four) times daily.   pantoprazole 40 MG tablet Commonly known as:  PROTONIX Take 40 mg by mouth daily.    polyethylene glycol packet Commonly known as:  MIRALAX / GLYCOLAX Take 17 g by mouth daily.   predniSONE 2.5 MG tablet Commonly known as:  DELTASONE Take 7.5 mg by mouth daily.   vitamin B-12 1000 MCG tablet Commonly known as:  CYANOCOBALAMIN Take 1,000 mcg by mouth daily.     Relevant Imaging Results:  Relevant Lab Results:   Additional Information SSN [id number]  Darleene Cleaver, [place]

## 2018-03-16 NOTE — Progress Notes (Signed)
Patient being transferred to ALF.  EMS on the way.  PAtient cleaned up, telemetry box off and PIV out.

## 2018-03-16 NOTE — Progress Notes (Signed)
Progress Note  Patient Name: Cindy Hogan Date of Encounter: 03/16/2018  Primary Cardiologist: [facility] - Dr. Okey Dupre  Subjective   Continues to have poor p.o. intake " Like tomato soup" Difficulty swallowing pills, difficulty getting down very low-dose metoprolol,  Will spit out applesauce, yogurt and pudding No significant complaints apart from trauma to her mouth Per nursing, increasingly alert and communicative  Inpatient Medications    Scheduled Meds: . docusate sodium  100 mg Oral BID  . DULoxetine  30 mg Oral Daily  . enoxaparin (LOVENOX) injection  40 mg Subcutaneous Q24H  . escitalopram  5 mg Oral Daily  . mouth rinse  15 mL Mouth Rinse BID  . methotrexate  12.5 mg Oral Q Thu  . metoprolol tartrate  12.5 mg Oral BID  . nystatin  5 mL Oral QID  . pantoprazole  40 mg Oral Daily  . polyethylene glycol  17 g Oral Daily  . predniSONE  7.5 mg Oral Daily  . senna-docusate  2 tablet Oral QHS  . vitamin B-12  1,000 mcg Oral Daily   Continuous Infusions:  PRN Meds: acetaminophen **OR** acetaminophen, bisacodyl, ondansetron **OR** ondansetron (ZOFRAN) IV   Vital Signs    Vitals:   03/16/18 0500 03/16/18 0500 03/16/18 0538 03/16/18 0820  BP:  91/61  107/60  Pulse:  (!) 125 98 84  Resp:  17    Temp:  98.5 F (36.9 C)  98.4 F (36.9 C)  TempSrc:  Oral  Oral  SpO2:  96%  99%  Weight: 72.9 kg     Height:        Intake/Output Summary (Last 24 hours) at 03/16/2018 1729 Last data filed at 03/16/2018 3299 Gross per 24 hour  Intake -  Output 400 ml  Net -400 ml   Filed Weights   03/14/18 0332 03/15/18 0421 03/16/18 0500  Weight: 72 kg 72.3 kg 72.9 kg    Telemetry    Atrial fibrillation, rate 90-115- Personally Reviewed  ECG    - Personally Reviewed  Physical Exam   Constitutional:  oriented to person, place, and time. No distress. Laying in bed with neck collar in place HENT:  Head: Normocephalic and atraumatic.  Eyes:  no discharge. No scleral icterus.   Neck: Collar in place,  neck supple. No JVD present.  Cardiovascular: Irregularly irregular,  normal heart sounds and intact distal pulses. Exam reveals no gallop and no friction rub. No edema No murmur heard. Pulmonary/Chest: Effort normal and breath sounds normal. No stridor. No respiratory distress.  no wheezes.  no rales.  no tenderness.  Abdominal: Soft.  no distension.  no tenderness.  Musculoskeletal: Normal range of motion.   Neurological: Full exam not performed, grossly nonfocal Skin: Skin is warm and dry. No rash noted. not diaphoretic.  Psychiatric:  normal mood and affect. behavior is normal.      Labs    Chemistry Recent Labs  Lab 03/13/18 0231 03/14/18 0549 03/14/18 1117 03/15/18 0317  NA 130* 134*  --  136  K 4.3 5.1 4.4 3.9  CL 92* 99  --  103  CO2 24 22  --  25  GLUCOSE 144* 113*  --  99  BUN 36* 35*  --  27*  CREATININE 1.86* 1.70*  --  1.32*  CALCIUM 8.9 8.4*  --  8.2*  GFRNONAA 25* 28*  --  37*  GFRAA 29* 32*  --  43*  ANIONGAP 14 13  --  8  Hematology Recent Labs  Lab 03/13/18 0231 03/14/18 1435 03/15/18 0317  WBC 5.4 4.5 4.3  RBC 3.74* 2.97* 3.16*  HGB 10.5* 8.4* 8.9*  HCT 34.9* 28.6* 30.0*  MCV 93.3 96.3 94.9  MCH 28.1 28.3 28.2  MCHC 30.1 29.4* 29.7*  RDW 22.7* 22.6* 22.8*  PLT 295 295 306    Cardiac Enzymes Recent Labs  Lab 03/13/18 0231  TROPONINI 0.06*   No results for input(s): TROPIPOC in the last 168 hours.   BNPNo results for input(s): BNP, PROBNP in the last 168 hours.   DDimer No results for input(s): DDIMER in the last 168 hours.   Radiology    No results found.  Cardiac Studies   Echocardiogram  - Left ventricle: The cavity size was normal. Systolic function was   normal. The estimated ejection fraction was in the range of 55%   to 65%. - Mitral valve: There was mild regurgitation. - Pulmonary arteries: PA peak pressure: 59 mm Hg (S).  Patient Profile     Cindy Hogan is a 84 y.o. female with a  hx of hypertensive heart disease / ICM, chronic diastolic heart failure (Ef 55-65%), h/o SSS with subsequent Med[employer] dual chamber pacemaker implantation, permanent atrial fibrillation on anticoagulation, anemia, DM2 not on medication (11/7 A1C 7.0), venous stasis dermatitis, rheumatoid arthritis, osteoporosis, glaucoma, bilateral hearing loss with hearing aids, anxiety, depression, and GERD who is being seen today for the evaluation of Atrial Fibrillation  Assessment & Plan    Atrial fibrillation Given frequent falls, anticoagulation contraindicated Decision whether to restart anticoagulation could be made at a later date by outpatient cardiology at [facility] -If she is remains essentially bedbound could consider restarting anticoagulation -Not tolerating pills very well, continues to spit them out Would continue metoprolol 12.5 twice daily  Could advance dose of metoprolol as blood pressure tolerates -For elevated rate would add a low-dose digoxin, could load 0.25 mg x 1 with 0.0625 daily  Fall Notes indicating history of falls Rehab/placement  Anemia Hemoglobin 8 to 9 Anti-coagulation on hold  Poor nutrition.  May need supplemental iron  Acute renal failure Creatinine stable 1.32 BUN 27 Does not need diuretics, poor p.o. intake given debility and inability to feed herself well  Case discussed with nursing  total encounter time more than 25 minutes  Greater than 50% was spent in counseling and coordination of care with the patient   For questions or updates, please contact [facility] Please consult [email or web address] for contact info under        Signed, Julien Nordmann, MD  03/16/2018, 5:29 PM

## 2018-03-16 NOTE — Discharge Summary (Addendum)
[facility]   PATIENT NAME: Cindy Hogan    MR#:  [id number]  DATE OF BIRTH:  January 23, 1934  DATE OF ADMISSION:  03/13/2018 ADMITTING PHYSICIAN: Arnaldo Natal, MD  DATE OF DISCHARGE: 03/16/2018  PRIMARY CARE PHYSICIAN: Dorothey Baseman, MD    ADMISSION DIAGNOSIS:  Elevated troponin [R79.89] Acute on chronic renal insufficiency [N28.9, N18.9] Atrial fibrillation with rapid ventricular response (HCC) [I48.91]  DISCHARGE DIAGNOSIS:  Active Problems:   Atrial fibrillation with RVR (HCC)   Pressure injury of skin   Permanent atrial fibrillation   Acute on chronic renal insufficiency   Atrial fibrillation with rapid ventricular response (HCC)    Palliative care encounter   SECONDARY DIAGNOSIS:   Past Medical History:  Diagnosis Date  . Anemia   . Anxiety   . Arthritis   . Atrial fibrillation (HCC)    on chronic anticoagulation with Xarelto  . Depression   . Diabetes (HCC)    Pending updated medical records and A1C with stepdaughter reporting history of DM2 on medications  . Diastolic heart failure (HCC)    12/17 Echo: EF normal. PA peak pressure: 59 mm Hg   . Dysphasia   . Frequent falls   . GERD (gastroesophageal reflux disease)   . Pacemaker    Medtronic    HOSPITAL COURSE:   84 year old female with history of chronic diastolic heart failure preserved ejection fraction, history of sick sinus syndrome with dual-chamber pacemaker and permanent atrial fibrillation on anticoagulation who presented the emergency room due to increased heart rate.   1.  Atrial fibrillation with RVR: Patient was evaluated by cardiology.  As per cardiologist due to high risk of bleeding and falls anticoagulation was discontinued.  Her blood pressure was low/normal.  Her heart rates are better controlled on low-dose Lopressor 12.5 twice daily.  2.  Elevated troponin: This is due to demand ischemia from atrial fibrillation.  Patient ruled out for  ACS  3.  Acute kidney injury in the setting of dehydration with improved with IV fluids  4.  Acute metabolic encephalopathy due to dehydration and hypotension  5.  Chronic diastolic heart failure:  6.  Acute on chronic anemia: Hemoglobin remained stable  7.  Consult requested for right hand Wound type: Full thickness abrasion/skin tear Measurement: 3.5X2X.1cm Wound bed: skin approximated over 50% of the wound bed; 50% red  Drainage (amount, consistency, odor) small amt tan drainage, no odor or bleeding Periwound: bruising surrounding the location Dressing procedure/placement/frequency: Xeroform gauze to protect and promote healing.  Patient with overall very poor prognosis.  Palliative care was consulted due to patient's metabolic encephalopathy.  Patient will be discharged assisted living facility with hospice care DISCHARGE CONDITIONS AND DIET:   Stable for discharge on diet as tolerated/aspiration precautions regular diet  CONSULTS OBTAINED:  Treatment Team:  Iran Ouch, MD  DRUG ALLERGIES:   Allergies  Allergen Reactions  . Eliquis [Apixaban]   . Lisinopril   . Neosporin [Neomycin-Bacitracin Zn-Polymyx]   . Penicillins   . Sulfa Antibiotics     DISCHARGE MEDICATIONS:   Allergies as of 03/16/2018      Reactions   Eliquis [apixaban]    Lisinopril    Neosporin [neomycin-bacitracin Zn-polymyx]    Penicillins    Sulfa Antibiotics       Medication List    STOP taking these medications   albuterol 108 (90 Base) MCG/ACT inhaler Commonly known as:  PROVENTIL HFA;VENTOLIN HFA   alendronate 70 MG tablet Commonly  known as:  FOSAMAX   diltiazem 120 MG 24 hr capsule Commonly known as:  CARDIZEM CD   DULoxetine 30 MG capsule Commonly known as:  CYMBALTA   ferrous sulfate 325 (65 FE) MG tablet   fluconazole 100 MG tablet Commonly known as:  DIFLUCAN   folic acid 1 MG tablet Commonly known as:  FOLVITE   furosemide 40 MG tablet Commonly known as:   LASIX   multivitamin with minerals Tabs tablet   potassium chloride 10 MEQ tablet Commonly known as:  K-DUR,KLOR-CON   Rivaroxaban 15 MG Tabs tablet Commonly known as:  XARELTO   senna-docusate 8.6-50 MG tablet Commonly known as:  Senokot-S     TAKE these medications   escitalopram 5 MG tablet Commonly known as:  LEXAPRO Take 5 mg by mouth daily.   methotrexate 2.5 MG tablet Commonly known as:  RHEUMATREX Take 12.5 mg by mouth every Thursday. Caution:Chemotherapy. Protect from light.   metoprolol tartrate 25 MG tablet Commonly known as:  LOPRESSOR Take 0.5 tablets (12.5 mg total) by mouth 2 (two) times daily. What changed:    medication strength  how much to take   nystatin 100000 UNIT/ML suspension Commonly known as:  MYCOSTATIN Take 5 mLs by mouth 4 (four) times daily.   pantoprazole 40 MG tablet Commonly known as:  PROTONIX Take 40 mg by mouth daily.   polyethylene glycol packet Commonly known as:  MIRALAX / GLYCOLAX Take 17 g by mouth daily.   predniSONE 2.5 MG tablet Commonly known as:  DELTASONE Take 7.5 mg by mouth daily.   vitamin B-12 1000 MCG tablet Commonly known as:  CYANOCOBALAMIN Take 1,000 mcg by mouth daily.         Today   CHIEF COMPLAINT:   No acute events overnight.  Mental status is slowly improving   VITAL SIGNS:  Blood pressure 107/60, pulse 84, temperature 98.4 F (36.9 C), temperature source Oral, resp. rate 17, height 5\' 5"  (1.651 m), weight 72.9 kg, SpO2 99 %.   REVIEW OF SYSTEMS:  ROS   PHYSICAL EXAMINATION:  GENERAL:  84 y.o.-year-old patient lying in the bed with no acute distress.  Color NECK:  Supple, no jugular venous distention. No thyroid enlargement, no tenderness.  LUNGS: Normal breath sounds bilaterally, no wheezing, rales,rhonchi  No use of accessory muscles of respiration.  CARDIOVASCULAR: Irregular, irregular . No murmurs, rubs, or gallops.  ABDOMEN: Soft, non-tender, non-distended. Bowel sounds  present. No organomegaly or mass.  EXTREMITIES: No pedal edema, cyanosis, or clubbing.  PSYCHIATRIC: The patient is alert and oriented x 3.  SKIN: No obvious rash, lesion, or ulcer.   DATA REVIEW:   CBC Recent Labs  Lab 03/15/18 0317  WBC 4.3  HGB 8.9*  HCT 30.0*  PLT 306    Chemistries  Recent Labs  Lab 03/14/18 0549  03/15/18 0317  NA 134*  --  136  K 5.1   < > 3.9  CL 99  --  103  CO2 22  --  25  GLUCOSE 113*  --  99  BUN 35*  --  27*  CREATININE 1.70*  --  1.32*  CALCIUM 8.4*  --  8.2*  MG 1.9  --   --    < > = values in this interval not displayed.    Cardiac Enzymes Recent Labs  Lab 03/13/18 0231  TROPONINI 0.06*    Microbiology Results  @MICRORSLT48 @  RADIOLOGY:  No results found.    Allergies as of 03/16/2018  Reactions   Eliquis [apixaban]    Lisinopril    Neosporin [neomycin-bacitracin Zn-polymyx]    Penicillins    Sulfa Antibiotics       Medication List    STOP taking these medications   albuterol 108 (90 Base) MCG/ACT inhaler Commonly known as:  PROVENTIL HFA;VENTOLIN HFA   alendronate 70 MG tablet Commonly known as:  FOSAMAX   diltiazem 120 MG 24 hr capsule Commonly known as:  CARDIZEM CD   DULoxetine 30 MG capsule Commonly known as:  CYMBALTA   ferrous sulfate 325 (65 FE) MG tablet   fluconazole 100 MG tablet Commonly known as:  DIFLUCAN   folic acid 1 MG tablet Commonly known as:  FOLVITE   furosemide 40 MG tablet Commonly known as:  LASIX   multivitamin with minerals Tabs tablet   potassium chloride 10 MEQ tablet Commonly known as:  K-DUR,KLOR-CON   Rivaroxaban 15 MG Tabs tablet Commonly known as:  XARELTO   senna-docusate 8.6-50 MG tablet Commonly known as:  Senokot-S     TAKE these medications   escitalopram 5 MG tablet Commonly known as:  LEXAPRO Take 5 mg by mouth daily.   methotrexate 2.5 MG tablet Commonly known as:  RHEUMATREX Take 12.5 mg by mouth every Thursday. Caution:Chemotherapy.  Protect from light.   metoprolol tartrate 25 MG tablet Commonly known as:  LOPRESSOR Take 0.5 tablets (12.5 mg total) by mouth 2 (two) times daily. What changed:    medication strength  how much to take   nystatin 100000 UNIT/ML suspension Commonly known as:  MYCOSTATIN Take 5 mLs by mouth 4 (four) times daily.   pantoprazole 40 MG tablet Commonly known as:  PROTONIX Take 40 mg by mouth daily.   polyethylene glycol packet Commonly known as:  MIRALAX / GLYCOLAX Take 17 g by mouth daily.   predniSONE 2.5 MG tablet Commonly known as:  DELTASONE Take 7.5 mg by mouth daily.   vitamin B-12 1000 MCG tablet Commonly known as:  CYANOCOBALAMIN Take 1,000 mcg by mouth daily.          Stable for discharge ALF with hospice  Patient should follow up with hospice  CODE STATUS:     Code Status Orders  (From admission, onward)         Start     Ordered   03/13/18 1027  Do not attempt resuscitation (DNR)  Continuous    Question Answer Comment  In the event of cardiac or respiratory ARREST Do not call a "code blue"   In the event of cardiac or respiratory ARREST Do not perform Intubation, CPR, defibrillation or ACLS   In the event of cardiac or respiratory ARREST Use medication by any route, position, wound care, and other measures to relive pain and suffering. May use oxygen, suction and manual treatment of airway obstruction as needed for comfort.      03/13/18 1026        Code Status History    Date Active Date Inactive Code Status Order ID Comments User Context   03/13/2018 0713 03/13/2018 1026 Full Code [id number]  Arnaldo Natal, MD Inpatient    Advance Directive Documentation     Most Recent Value  Type of Advance Directive  Out of facility DNR (pink MOST or yellow form)  Pre-existing out of facility DNR order (yellow form or pink MOST form)  Pink MOST form placed in chart (order not valid for inpatient use)  "MOST" Form in Place?  -  TOTAL TIME  TAKING CARE OF THIS PATIENT: 38 minutes.    Note: This dictation was prepared with Dragon dictation along with smaller phrase technology. Any transcriptional errors that result from this process are unintentional.  Jabes Primo M.D on 03/16/2018 at 12:10 PM  Between 7am to 6pm - Pager - [phone] After 6pm go to [email or web address] - Social research officer, government  [facility]  Office  [phone]  CC: Primary care physician; Dorothey Baseman, MD

## 2018-03-16 NOTE — Plan of Care (Signed)
Dressing to right hand changed this morning. Cleansed, and xeroform, gauze & kerlex placed.  Problem: Nutrition: Goal: Adequate nutrition will be maintained Outcome: Progressing Note:  Did drink a couple juices for me this shift and ate some reese cups her daughter brought her   Problem: Coping: Goal: Level of anxiety will decrease Outcome: Progressing   Problem: Elimination: Goal: Will not experience complications related to bowel motility Outcome: Progressing Note:  Had 2 BM's this shift Goal: Will not experience complications related to urinary retention Outcome: Progressing Note:  Still not put out much urine & it is very dark   Problem: Pain Managment: Goal: General experience of comfort will improve Outcome: Progressing Note:  No complaints of pain this shift   Problem: Safety: Goal: Ability to remain free from injury will improve Outcome: Progressing   Problem: Cardiac: Goal: Ability to achieve and maintain adequate cardiopulmonary perfusion will improve Outcome: Progressing Note:  Still afib rate ranging from 70-110's

## 2018-03-24 NOTE — ED Provider Notes (Signed)
[facility] Department Provider Note   First MD Initiated Contact with Patient 03/13/18 [phone]     (approximate)  I have reviewed the triage vital signs and the nursing notes.   HISTORY  Chief Complaint Fall    HPI Cindy Hogan is a 84 y.o. female with below list of chronic medical conditions presents to the emergency department via EMS from [facility] where the patient was found on the floor following a presumed unwitnessed fall.  Patient admits to 8 out of 10 left hip pain with no other complaints.  Of note patient is currently on anticoagulation with Xarelto.   Past Medical History:  Diagnosis Date  . Anemia   . Anxiety   . Arthritis   . Atrial fibrillation (HCC)    on chronic anticoagulation with Xarelto  . Depression   . Diabetes (HCC)    Pending updated medical records and A1C with stepdaughter reporting history of DM2 on medications  . Diastolic heart failure (HCC)    12/17 Echo: EF normal. PA peak pressure: 59 mm Hg   . Dysphasia   . Frequent falls   . GERD (gastroesophageal reflux disease)   . Pacemaker    [employer]    Patient Active Problem List   Diagnosis Date Noted  . Acute on chronic renal insufficiency   . Atrial fibrillation with rapid ventricular response (HCC)   . Loss of weight   . Comfort measures only status   . Palliative care encounter   . Permanent atrial fibrillation   . Atrial fibrillation with RVR (HCC) 03/13/2018  . Pressure injury of skin 03/13/2018    Past Surgical History:  Procedure Laterality Date  . KNEE ARTHROCENTESIS      Prior to Admission medications   Medication Sig Start Date End Date Taking? Authorizing Provider  escitalopram (LEXAPRO) 5 MG tablet Take 5 mg by mouth daily.   Yes [provider]  methotrexate (RHEUMATREX) 2.5 MG tablet Take 12.5 mg by mouth every Thursday. Caution:Chemotherapy. Protect from light.   Yes [provider]  pantoprazole (PROTONIX) 40  MG tablet Take 40 mg by mouth daily.   Yes [provider]  polyethylene glycol (MIRALAX / GLYCOLAX) packet Take 17 g by mouth daily.   Yes [provider]  predniSONE (DELTASONE) 2.5 MG tablet Take 7.5 mg by mouth daily.   Yes [provider]  vitamin B-12 (CYANOCOBALAMIN) 1000 MCG tablet Take 1,000 mcg by mouth daily.   Yes [provider]  metoprolol tartrate (LOPRESSOR) 25 MG tablet Take 0.5 tablets (12.5 mg total) by mouth 2 (two) times daily. 03/16/18   Adrian Saran, MD    Allergies Eliquis [apixaban]; Lisinopril; Neosporin [neomycin-bacitracin zn-polymyx]; Penicillins; and Sulfa antibiotics  No family history on file.  Social History Social History   Tobacco Use  . Smoking status: Never Smoker  . Smokeless tobacco: Never Used  Substance Use Topics  . Alcohol use: Not Currently  . Drug use: Not Currently    Review of Systems Constitutional: No fever/chills Eyes: No visual changes. ENT: No sore throat. Cardiovascular: Denies chest pain. Respiratory: Denies shortness of breath. Gastrointestinal: No abdominal pain.  No nausea, no vomiting.  No diarrhea.  No constipation. Genitourinary: Negative for dysuria. Musculoskeletal: Negative for neck pain.  Negative for back pain.  Positive for left hip pain Integumentary: Negative for rash. Neurological: Negative for headaches, focal weakness or numbness.   ____________________________________________   PHYSICAL EXAM:  VITAL SIGNS: ED Triage Vitals  Enc  Vitals Group     BP 03/13/18 0305 (!) 130/107     Pulse Rate 03/13/18 0305 (!) 128     Resp 03/13/18 0305 (!) 22     Temp 03/13/18 0304 (!) 97.4 F (36.3 C)     Temp Source 03/13/18 0304 Rectal     SpO2 03/13/18 0305 95 %     Weight 03/13/18 0223 83.9 kg (185 lb)     Height 03/13/18 0223 1.651 m (5\' 5" )     Head Circumference --      [facility] --      Pain Score 03/13/18 0745 0     Pain Loc --      Pain Edu? --      Excl. in GC?  --     Constitutional: Alert and . Well appearing and in no acute distress. Eyes: Conjunctivae are normal. Head: Atraumatic.Marland Kitchen. Mouth/Throat: Mucous membranes are moist.  Oropharynx non-erythematous. Neck: No stridor.  No cervical spine tenderness to palpation. Cardiovascular: Tachycardia with regular rhythm good peripheral circulation. Grossly normal heart sounds. Respiratory: Normal respiratory effort.  No retractions. Lungs CTAB. Gastrointestinal: Soft and nontender. No distention.  Musculoskeletal: Pain with left hip palpation.  Pain with active and passive range of motion of the left hip Neurologic:  . No gross focal neurologic deficits are appreciated.  Skin:  Skin is warm, dry and intact. No rash noted. Psychiatric: Mood and affect are normal. Speech and behavior are normal.  ____________________________________________   LABS (all labs ordered are listed, but only abnormal results are displayed)  Labs Reviewed  BASIC METABOLIC PANEL - Abnormal; Notable for the following components:      Result Value   Sodium 130 (*)    Chloride 92 (*)    Glucose, Bld 144 (*)    BUN 36 (*)    Creatinine, Ser 1.86 (*)    GFR calc non Af Amer 25 (*)    GFR calc Af Amer 29 (*)    All other components within normal limits  CBC - Abnormal; Notable for the following components:   RBC 3.74 (*)    Hemoglobin 10.5 (*)    HCT 34.9 (*)    RDW 22.7 (*)    nRBC 0.6 (*)    All other components within normal limits  TROPONIN I - Abnormal; Notable for the following components:   Troponin I 0.06 (*)    All other components within normal limits  URINALYSIS, COMPLETE (UACMP) WITH MICROSCOPIC - Abnormal; Notable for the following components:   Color, Urine AMBER (*)    APPearance HAZY (*)    Hgb urine dipstick MODERATE (*)    Bacteria, UA RARE (*)    All other components within normal limits  BASIC METABOLIC PANEL - Abnormal; Notable for the following components:   Sodium 134 (*)    Glucose, Bld  113 (*)    BUN 35 (*)    Creatinine, Ser 1.70 (*)    Calcium 8.4 (*)    GFR calc non Af Amer 28 (*)    GFR calc Af Amer 32 (*)    All other components within normal limits  CBC WITH DIFFERENTIAL/PLATELET - Abnormal; Notable for the following components:   RBC 2.97 (*)    Hemoglobin 8.4 (*)    HCT 28.6 (*)    MCHC 29.4 (*)    RDW 22.6 (*)    nRBC 0.7 (*)    Lymphs Abs 0.6 (*)    Abs Immature Granulocytes 0.08 (*)  All other components within normal limits  HEMOGLOBIN A1C - Abnormal; Notable for the following components:   Hgb A1c MFr Bld 6.7 (*)    All other components within normal limits  BASIC METABOLIC PANEL - Abnormal; Notable for the following components:   BUN 27 (*)    Creatinine, Ser 1.32 (*)    Calcium 8.2 (*)    GFR calc non Af Amer 37 (*)    GFR calc Af Amer 43 (*)    All other components within normal limits  CBC WITH DIFFERENTIAL/PLATELET - Abnormal; Notable for the following components:   RBC 3.16 (*)    Hemoglobin 8.9 (*)    HCT 30.0 (*)    MCHC 29.7 (*)    RDW 22.8 (*)    Abs Immature Granulocytes 0.12 (*)    All other components within normal limits  MRSA PCR SCREENING  TSH  MAGNESIUM  POTASSIUM   ____________________________________________  EKG  ED ECG REPORT I, Leland N Maricela Schreur, the attending physician, personally viewed and interpreted this ECG.   Date: 03/13/2018  EKG Time: 2:23 AM  Rate: 128  Rhythm: Atrial fibrillation with rapid ventricular response  Axis: Normal  Intervals: Irregular RR interval  ST&T Change: None  ____________________________________________  RADIOLOGY I, Waller N Norrine Ballester, personally viewed and evaluated these images (plain radiographs) as part of my medical decision making, as well as reviewing the written report by the radiologist.  ED MD interpretation: CT head cervical spine revealed no acute cervical or intracranial pathology. CT pelvis revealed no pelvic fractures per radiologist.  Official radiology  report(s): No results found.  ____________________________________________   PROCEDURES  Critical Care performed:  .Critical Care Performed by: Darci Current, MD Authorized by: Darci Current, MD   Critical care provider statement:    Critical care time (minutes):  30   Critical care time was exclusive of:  Separately billable procedures and treating other patients   Critical care was necessary to treat or prevent imminent or life-threatening deterioration of the following conditions:  Cardiac failure and circulatory failure   Critical care was time spent personally by me on the following activities:  Development of treatment plan with patient or surrogate, discussions with consultants, evaluation of patient's response to treatment, examination of patient, obtaining history from patient or surrogate, ordering and performing treatments and interventions, ordering and review of laboratory studies, ordering and review of radiographic studies, pulse oximetry, re-evaluation of patient's condition and review of old charts     ____________________________________________   INITIAL IMPRESSION / ASSESSMENT AND PLAN / ED COURSE  As part of my medical decision making, I reviewed the following data within the electronic MEDICAL RECORD NUMBER  83 year old female presenting with above-stated history and physical exam following unwitnessed fall.  Patient noted to be in atrial fibrillation with rapid ventricular response and as such to IV boluses of diltiazem 1 were given without rate control.  Patient subsequently placed on a diltiazem infusion.  Patient discussed with Dr. Sheryle Hail for hospital admission further evaluation and management. ____________________________________________  FINAL CLINICAL IMPRESSION(S) / ED DIAGNOSES  Final diagnoses:  Atrial fibrillation with rapid ventricular response (HCC)  Elevated troponin  Acute on chronic renal insufficiency     MEDICATIONS GIVEN DURING  THIS VISIT:  Medications  0.9 %  sodium chloride infusion ( Intravenous Stopped 03/14/18 0844)  diltiazem (CARDIZEM) injection 10 mg (10 mg Intravenous Given 03/13/18 0243)  morphine 2 MG/ML injection 2 mg (2 mg Intravenous Given 03/13/18 0356)  diltiazem (CARDIZEM) injection  10 mg (10 mg Intravenous Given 03/13/18 0417)  sodium chloride 0.9 % bolus 500 mL (500 mLs Intravenous New Bag/Given 03/13/18 0810)     ED Discharge Orders         Ordered    metoprolol tartrate (LOPRESSOR) 25 MG tablet  2 times daily     03/16/18 1208    Discharge instructions    Comments:  DIET: Regular diet  DISCHARGE CONDITION: Stable  ACTIVITY: Activity as tolerated       If you experience worsening of your admission symptoms, develop shortness of breath, life threatening emergency, suicidal or homicidal thoughts you must seek medical attention immediately by calling 911 or calling your MD immediately  if symptoms less severe.  You Must read complete instructions/literature along with all the possible adverse reactions/side effects for all the Medicines you take and that have been prescribed to you. Take any new Medicines after you have completely understood and accept all the possible adverse reactions/side effects.   Please note  You were cared for by a hospitalist during your hospital stay. If you have any questions about your discharge medications or the care you received while you were in the hospital after you are discharged, you can call the unit and asked to speak with the hospitalist on call if the hospitalist that took care of you is not available. Once you are discharged, your primary care physician will handle any further medical issues. Please note that NO REFILLS for any discharge medications will be authorized once you are discharged, as it is imperative that you return to your primary care physician (or establish a relationship with a primary care physician if you do not have one) for your  aftercare needs so that they can reassess your need for medications and monitor your lab values.   03/16/18 1208           Note:  This document was prepared using Dragon voice recognition software and may include unintentional dictation errors.    Darci Current, MD  [phone]

## 2018-03-29 DEATH — deceased

## 2020-12-13 IMAGING — CT CT PELVIS W/O CM
2 of 3 series · 15 of 46 positions shown, 17 images · non-contrast
Comparison: Radiographs earlier this day.

CLINICAL DATA: Post fall with left hip pain.

EXAM:
CT PELVIS WITHOUT CONTRAST
TECHNIQUE: Multidetector CT imaging of the pelvis was performed following the
standard protocol without intravenous contrast.

[Series 3: axial st · axial · 0.96mm/px · z∈[-1188,-974]mm · 12 of 123 slices shown, 14 images]
[im 8/123  soft-tissue]
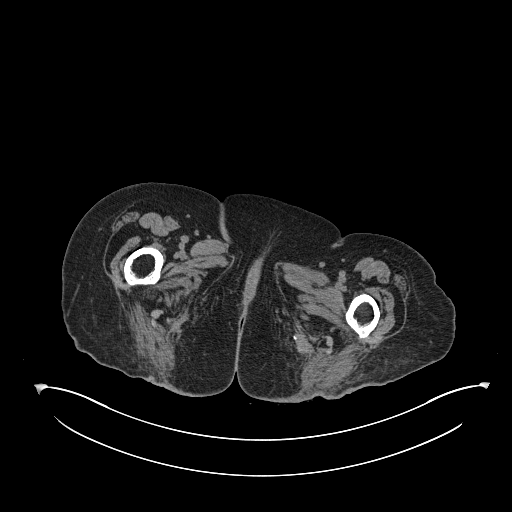
[im 8/123  bone]
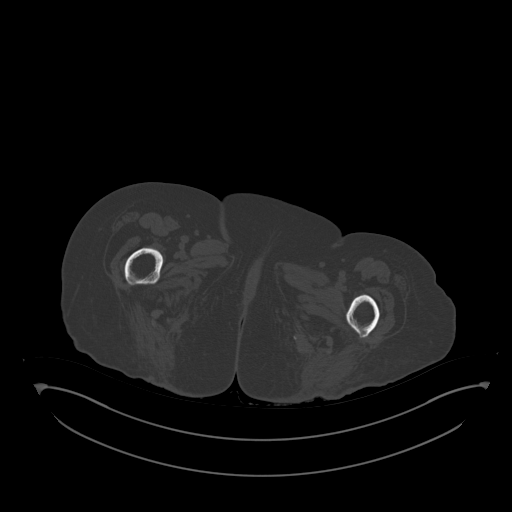
[im 16/123  soft-tissue]
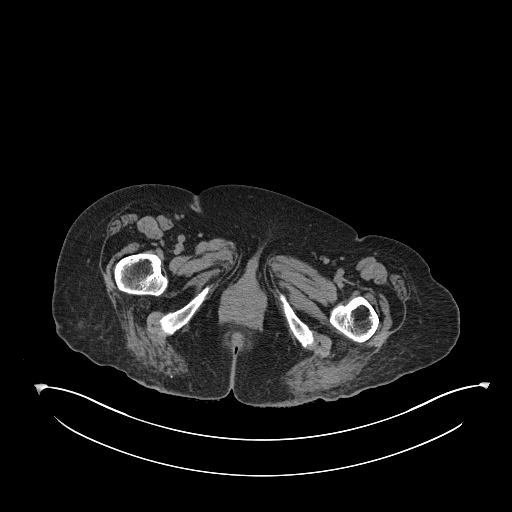
[im 28/123  soft-tissue]
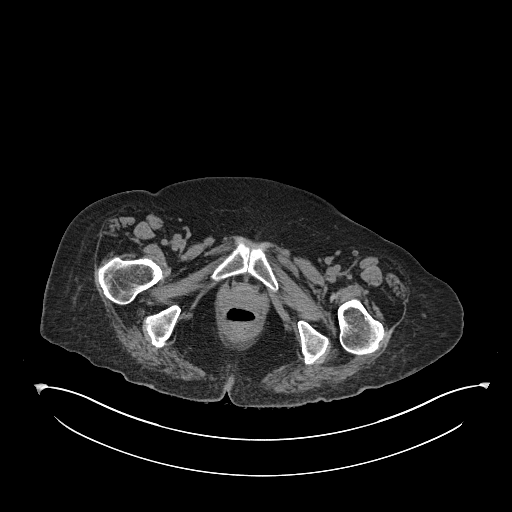
[im 36/123  soft-tissue]
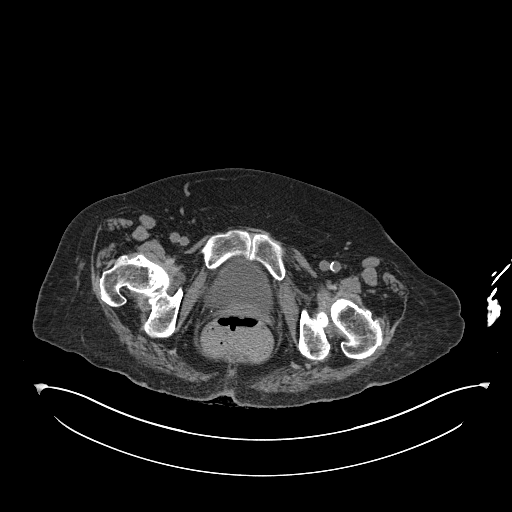
[im 48/123  soft-tissue]
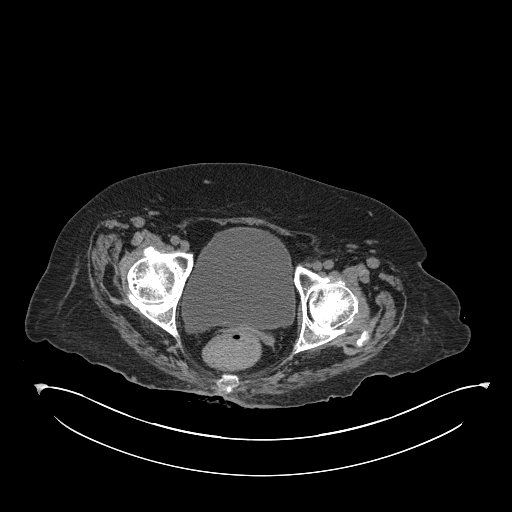
[im 56/123  soft-tissue]
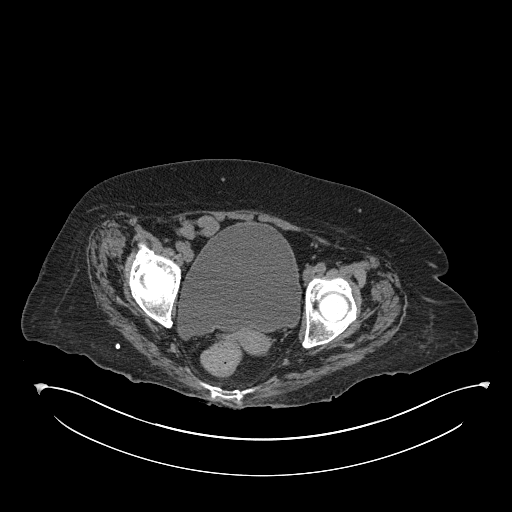
[im 67/123  soft-tissue]
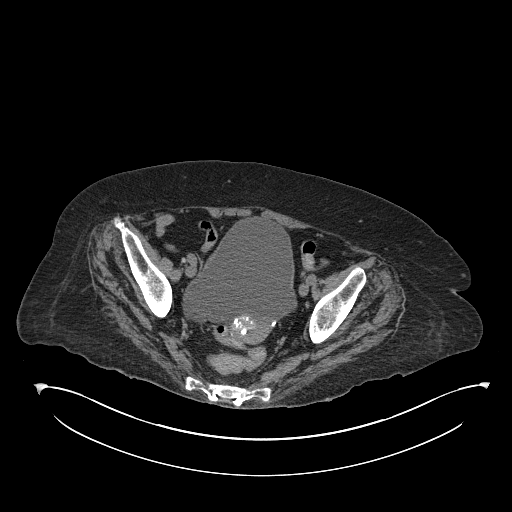
[im 75/123  soft-tissue]
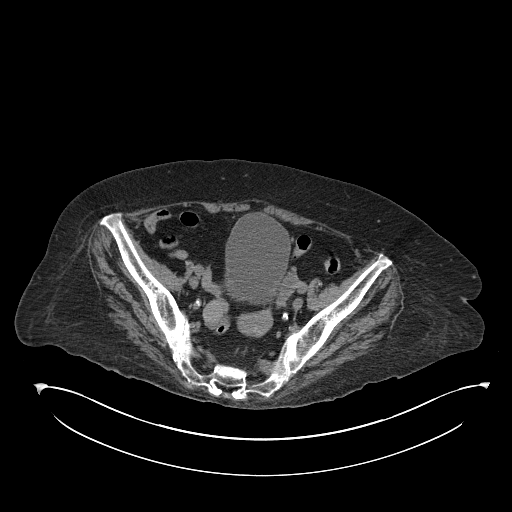
[im 87/123  soft-tissue]
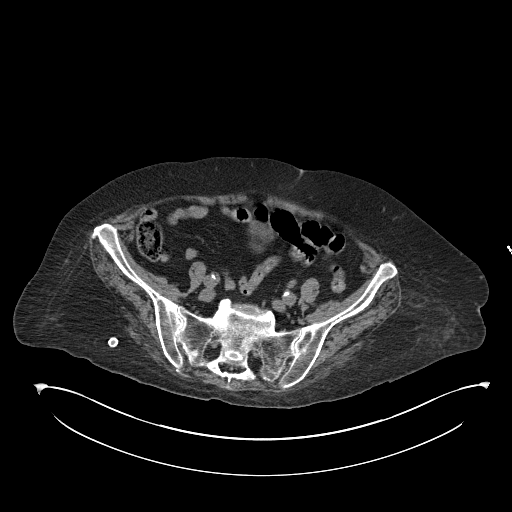
[im 87/123  bone]
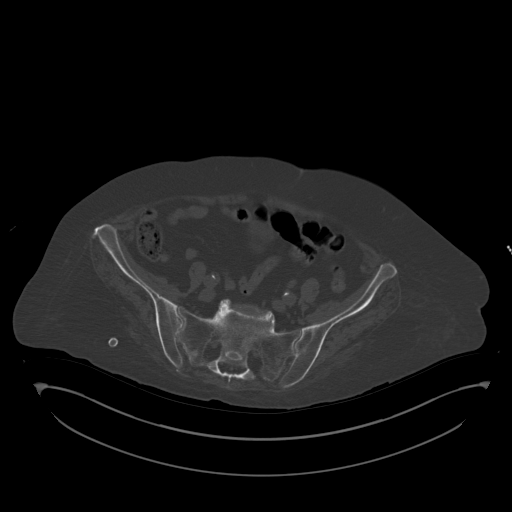
[im 95/123  soft-tissue]
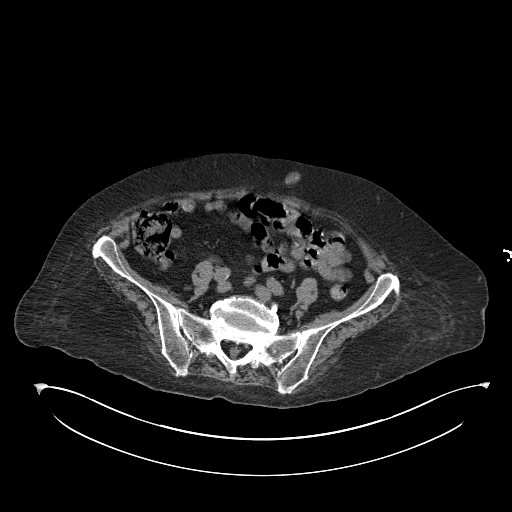
[im 107/123  soft-tissue]
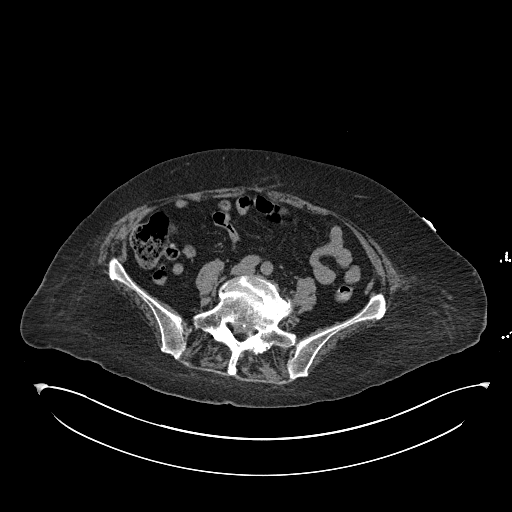
[im 115/123  soft-tissue]
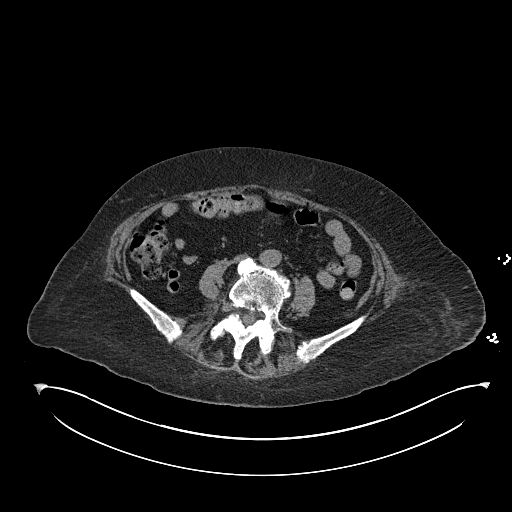

[Series 6: coronal st · coronal · 0.50mm/px · 3 of 105 slices shown]
[im 35/105  soft-tissue]
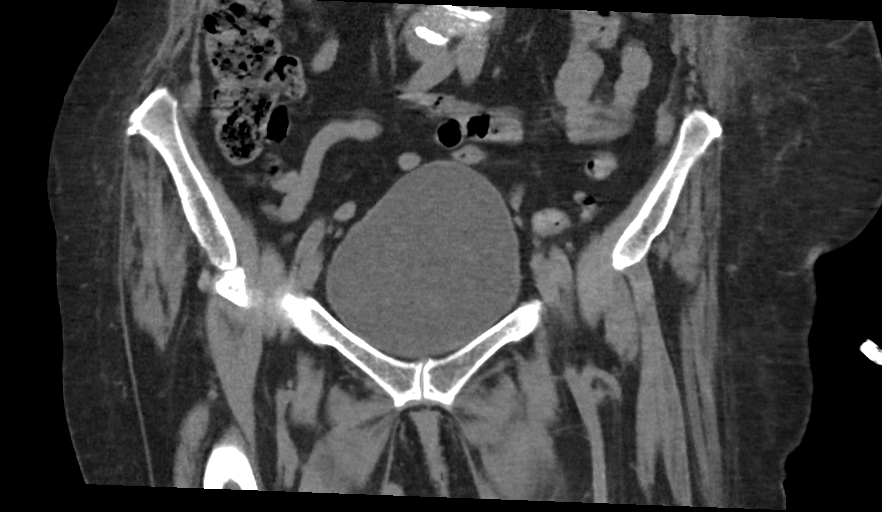
[im 47/105  soft-tissue]
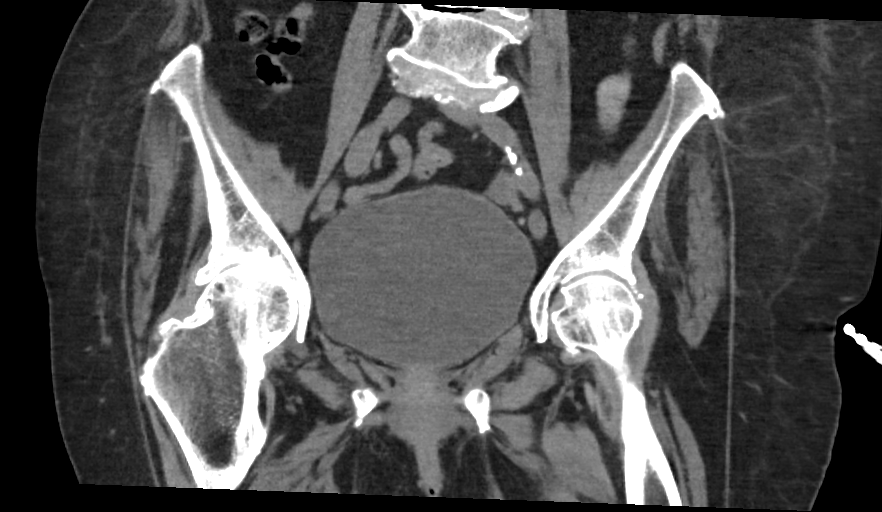
[im 58/105  soft-tissue]
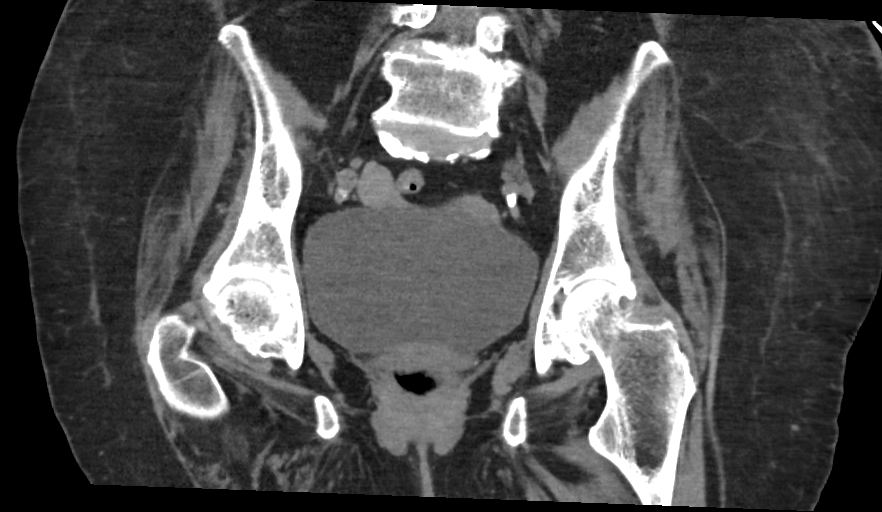

[15 of 46 positions shown; findings below may reference images not displayed]

FINDINGS: Urinary Tract: Distal ureters are decompressed. Urinary bladder is
distended without wall thickening.

Bowel: Minimal colonic diverticulosis. Pelvic bowel loops are
unremarkable. Normal appendix.

Vascular/Lymphatic: Mild aorta bi-iliac atherosclerosis. No
adenopathy.

Reproductive: Uterine calcifications likely fibroids and vascular.
2.5 cm cystic structure in the left adnexa.

Other:  No pelvic free fluid.

Musculoskeletal: No pelvic fracture. Particularly, no fracture of
the left inferior pubic ramus as questioned on radiograph. Moderate
to advanced bilateral hip osteoarthritis. Pubic symphysis is
congruent. Mild degenerative change of the sacroiliac joints.
Prominent degenerative change in the lower lumbar spine.
IMPRESSION: 1. No pelvic fracture. Particularly, no left inferior pubic ramus
fracture is questioned on radiograph.
2. Moderate to advanced bilateral hip osteoarthritis.
3. A 2.5 cm cystic structure in the left adnexa is most likely
benign, given size less than 3 cm and late postmenopausal status, no
dedicated imaging follow-up is needed.

## 2020-12-13 IMAGING — CR DG HIP (WITH OR WITHOUT PELVIS) 2-3V*L*
1 series · 3 of 3 positions shown · non-contrast
Comparison: None.

CLINICAL DATA: Post fall with left hip pain.

EXAM:
DG HIP (WITH OR WITHOUT PELVIS) 2-3V LEFT

[Series 1: dg hip unilat w or w/o pelvis 2-3 views  · non-contrast · 0.14mm/px · 3 of 3 slices shown]
[im 1/3]
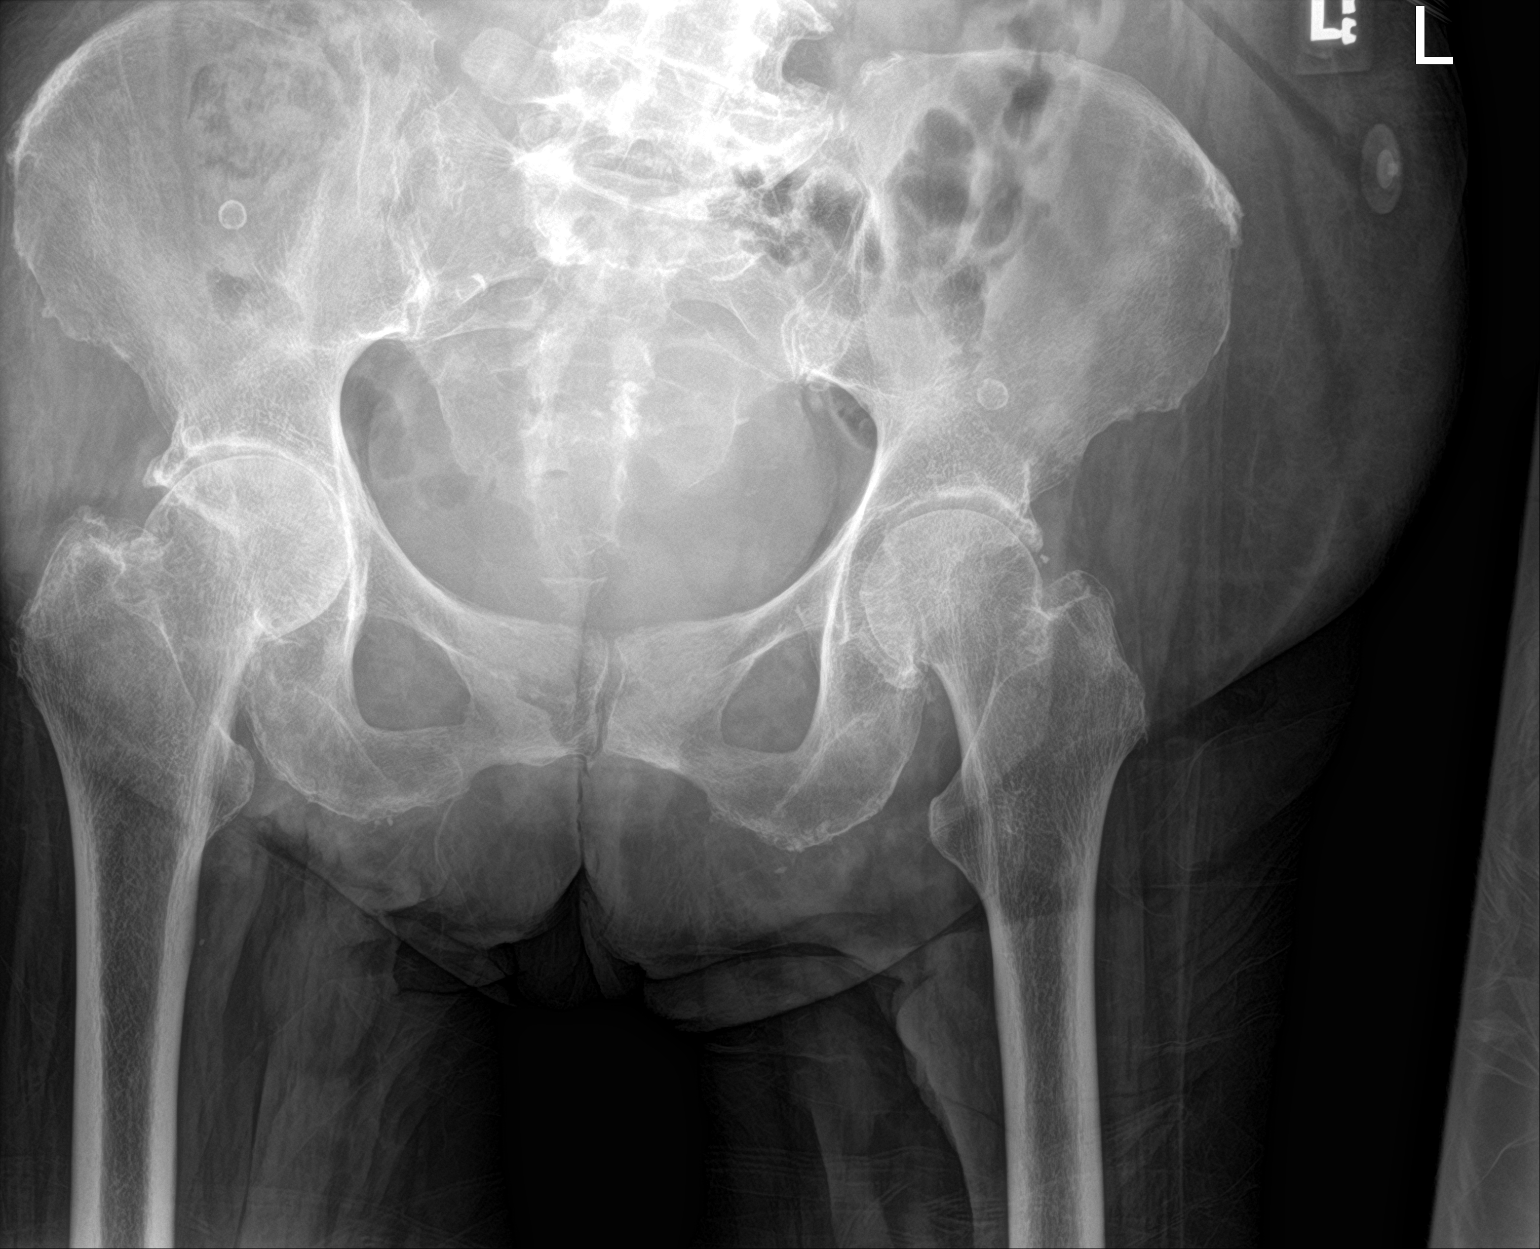
[im 2/3]
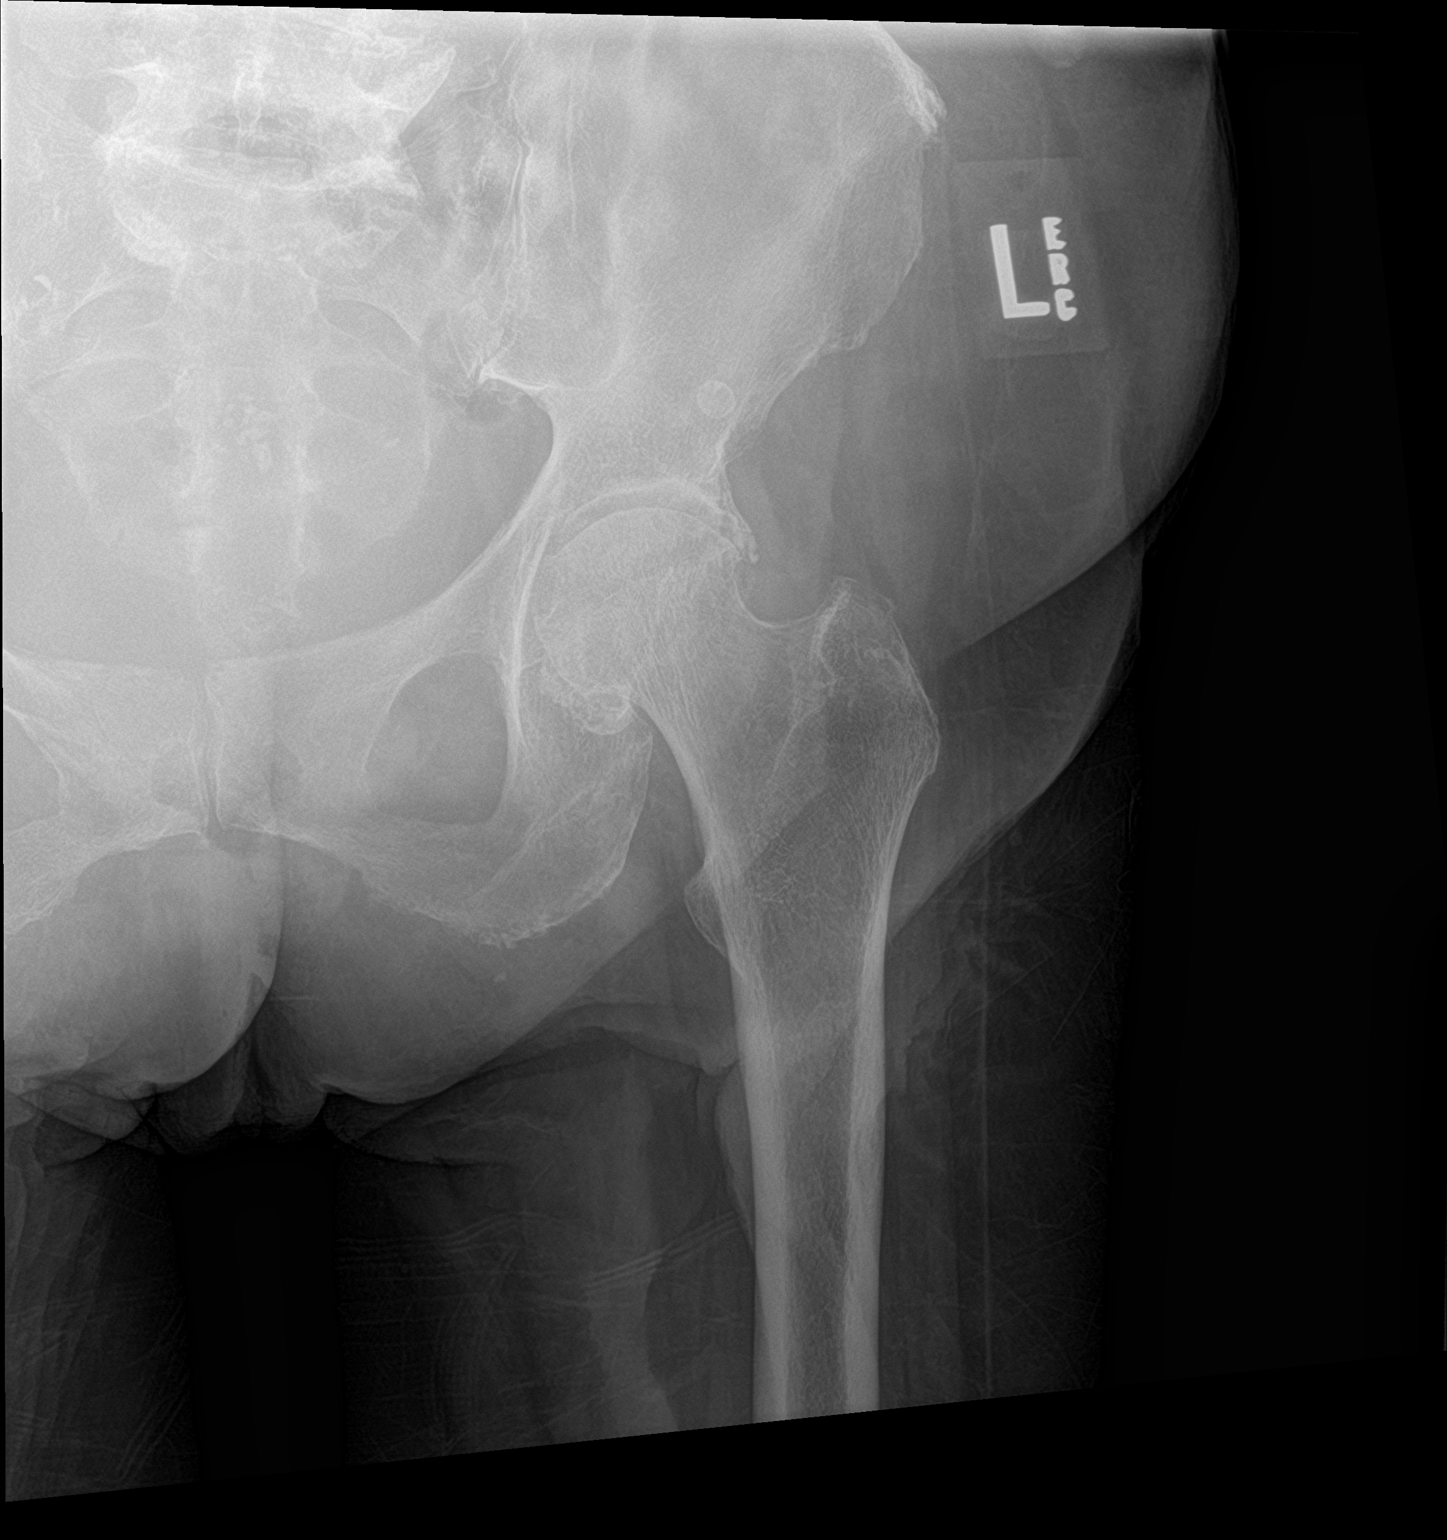
[im 3/3]
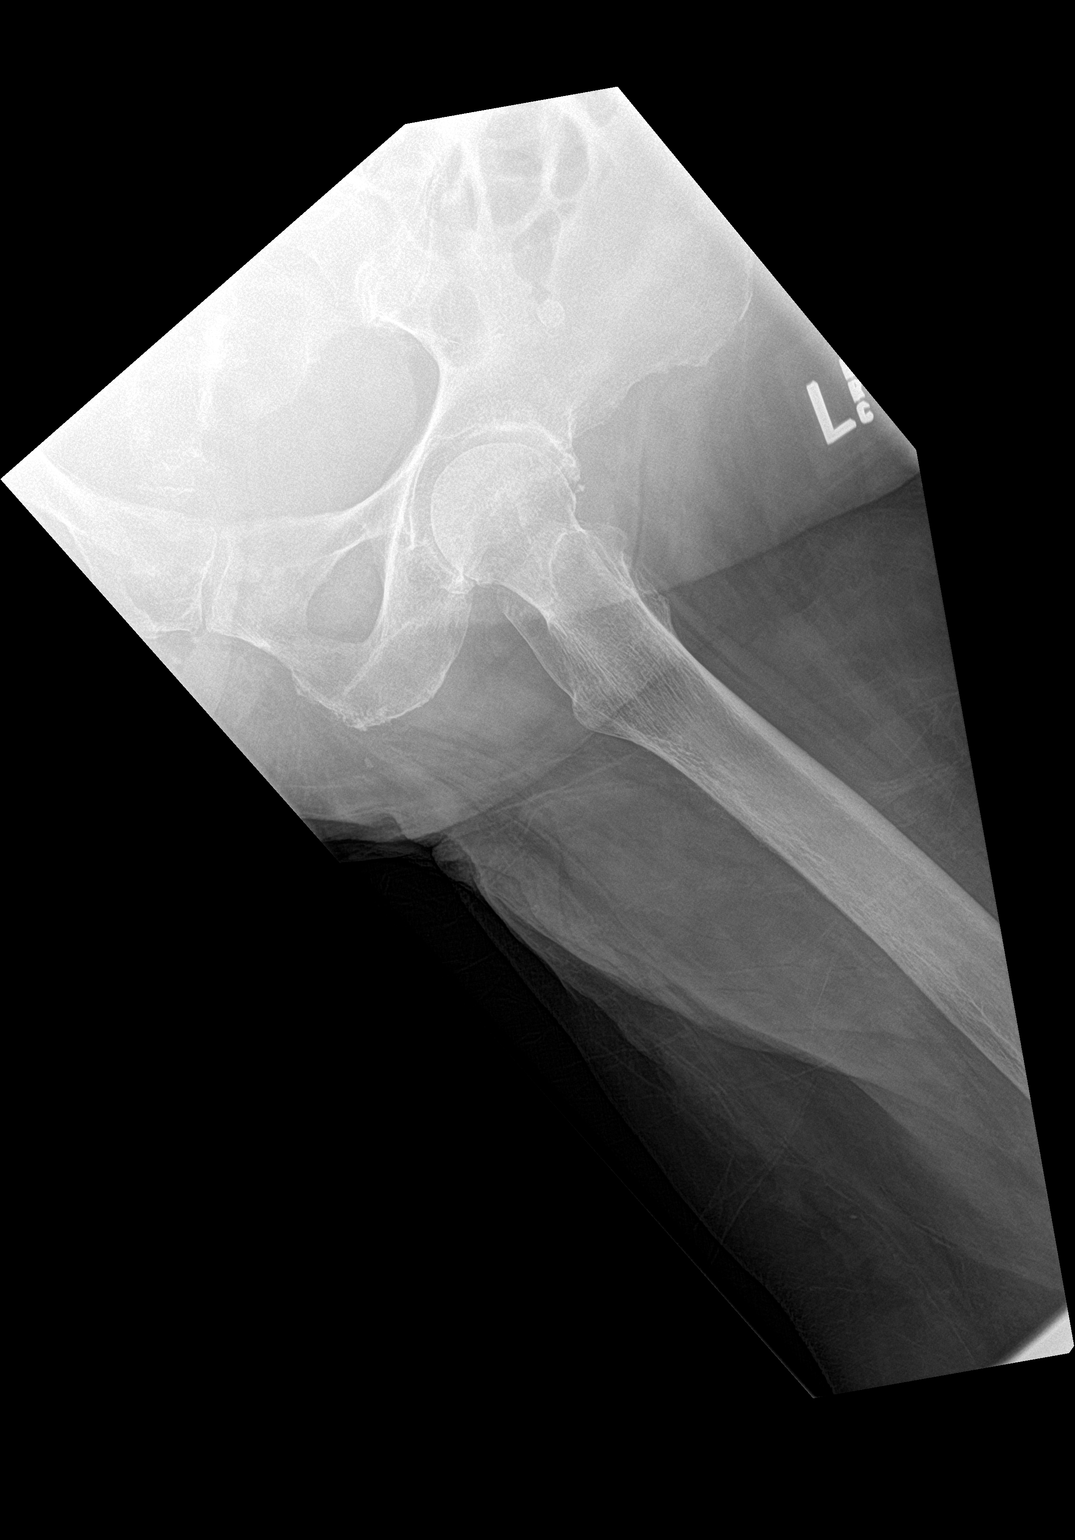

[3 of 3 positions shown; findings below may reference images not displayed]

FINDINGS: Possible but not definite nondisplaced left inferior ramus fracture.
No other fracture of the pelvis. Pubic symphysis and sacroiliac
joints are congruent. Moderate to advanced bilateral hip
osteoarthritis. Both femoral heads are well-seated in the respective
acetabula.
IMPRESSION: 1. Possible but not definite nondisplaced left inferior ramus
fracture.
2. Moderate to advanced bilateral hip osteoarthritis.
